# Patient Record
Sex: Female | Born: 1950
Health system: Southern US, Community
[De-identification: ages and names within clinical notes are randomized; demographics above are authoritative.]

## PROBLEM LIST (undated history)

## (undated) DIAGNOSIS — M1990 Unspecified osteoarthritis, unspecified site: Secondary | ICD-10-CM

## (undated) DIAGNOSIS — I10 Essential (primary) hypertension: Secondary | ICD-10-CM

---

## 2000-10-08 ENCOUNTER — Emergency Department (HOSPITAL_COMMUNITY): Admission: EM | Admit: 2000-10-08 | Discharge: 2000-10-08 | Payer: Self-pay | Admitting: *Deleted

## 2002-02-12 ENCOUNTER — Emergency Department (HOSPITAL_COMMUNITY): Admission: EM | Admit: 2002-02-12 | Discharge: 2002-02-12 | Payer: Self-pay

## 2002-02-26 ENCOUNTER — Other Ambulatory Visit: Admission: RE | Admit: 2002-02-26 | Discharge: 2002-02-26 | Payer: Self-pay | Admitting: Obstetrics and Gynecology

## 2002-04-09 ENCOUNTER — Other Ambulatory Visit: Admission: RE | Admit: 2002-04-09 | Discharge: 2002-04-09 | Payer: Self-pay | Admitting: *Deleted

## 2002-04-09 ENCOUNTER — Encounter: Admission: RE | Admit: 2002-04-09 | Discharge: 2002-04-09 | Payer: Self-pay | Admitting: *Deleted

## 2002-04-09 ENCOUNTER — Encounter (INDEPENDENT_AMBULATORY_CARE_PROVIDER_SITE_OTHER): Payer: Self-pay

## 2002-05-07 ENCOUNTER — Encounter: Admission: RE | Admit: 2002-05-07 | Discharge: 2002-05-07 | Payer: Self-pay | Admitting: Obstetrics and Gynecology

## 2002-05-20 ENCOUNTER — Ambulatory Visit (HOSPITAL_COMMUNITY): Admission: RE | Admit: 2002-05-20 | Discharge: 2002-05-20 | Payer: Self-pay | Admitting: Obstetrics and Gynecology

## 2002-05-20 ENCOUNTER — Encounter (INDEPENDENT_AMBULATORY_CARE_PROVIDER_SITE_OTHER): Payer: Self-pay

## 2002-06-04 ENCOUNTER — Encounter: Admission: RE | Admit: 2002-06-04 | Discharge: 2002-06-04 | Payer: Self-pay | Admitting: Obstetrics and Gynecology

## 2002-06-14 ENCOUNTER — Encounter: Payer: Self-pay | Admitting: Emergency Medicine

## 2002-06-14 ENCOUNTER — Inpatient Hospital Stay (HOSPITAL_COMMUNITY): Admission: EM | Admit: 2002-06-14 | Discharge: 2002-06-21 | Payer: Self-pay | Admitting: Emergency Medicine

## 2002-06-15 ENCOUNTER — Encounter: Payer: Self-pay | Admitting: General Surgery

## 2002-06-19 ENCOUNTER — Encounter: Payer: Self-pay | Admitting: Surgery

## 2002-07-23 ENCOUNTER — Encounter (INDEPENDENT_AMBULATORY_CARE_PROVIDER_SITE_OTHER): Payer: Self-pay | Admitting: Specialist

## 2002-07-23 ENCOUNTER — Ambulatory Visit (HOSPITAL_COMMUNITY): Admission: RE | Admit: 2002-07-23 | Discharge: 2002-07-23 | Payer: Self-pay | Admitting: Gastroenterology

## 2002-11-24 ENCOUNTER — Emergency Department (HOSPITAL_COMMUNITY): Admission: EM | Admit: 2002-11-24 | Discharge: 2002-11-24 | Payer: Self-pay | Admitting: Emergency Medicine

## 2002-12-17 ENCOUNTER — Encounter: Admission: RE | Admit: 2002-12-17 | Discharge: 2002-12-17 | Payer: Self-pay | Admitting: Obstetrics and Gynecology

## 2007-07-23 ENCOUNTER — Emergency Department (HOSPITAL_COMMUNITY): Admission: EM | Admit: 2007-07-23 | Discharge: 2007-07-23 | Payer: Self-pay | Admitting: Emergency Medicine

## 2007-11-15 ENCOUNTER — Ambulatory Visit: Payer: Self-pay | Admitting: Family Medicine

## 2007-11-19 ENCOUNTER — Ambulatory Visit: Payer: Self-pay | Admitting: *Deleted

## 2007-11-19 ENCOUNTER — Ambulatory Visit (HOSPITAL_COMMUNITY): Admission: RE | Admit: 2007-11-19 | Discharge: 2007-11-19 | Payer: Self-pay | Admitting: Family Medicine

## 2008-05-16 ENCOUNTER — Ambulatory Visit: Payer: Self-pay | Admitting: Family Medicine

## 2008-06-18 ENCOUNTER — Ambulatory Visit: Payer: Self-pay | Admitting: Family Medicine

## 2008-07-16 ENCOUNTER — Ambulatory Visit: Payer: Self-pay | Admitting: Internal Medicine

## 2008-07-21 ENCOUNTER — Ambulatory Visit (HOSPITAL_COMMUNITY): Admission: RE | Admit: 2008-07-21 | Discharge: 2008-07-21 | Payer: Self-pay | Admitting: Family Medicine

## 2008-08-18 ENCOUNTER — Encounter (INDEPENDENT_AMBULATORY_CARE_PROVIDER_SITE_OTHER): Payer: Self-pay | Admitting: Family Medicine

## 2008-08-18 ENCOUNTER — Ambulatory Visit: Payer: Self-pay | Admitting: Family Medicine

## 2008-08-26 ENCOUNTER — Ambulatory Visit (HOSPITAL_COMMUNITY): Admission: RE | Admit: 2008-08-26 | Discharge: 2008-08-26 | Payer: Self-pay | Admitting: Internal Medicine

## 2008-10-15 ENCOUNTER — Ambulatory Visit: Payer: Self-pay | Admitting: Internal Medicine

## 2009-01-17 ENCOUNTER — Emergency Department (HOSPITAL_COMMUNITY): Admission: EM | Admit: 2009-01-17 | Discharge: 2009-01-17 | Payer: Self-pay | Admitting: Emergency Medicine

## 2009-01-30 ENCOUNTER — Emergency Department (HOSPITAL_COMMUNITY): Admission: EM | Admit: 2009-01-30 | Discharge: 2009-01-30 | Payer: Self-pay | Admitting: Emergency Medicine

## 2009-02-10 ENCOUNTER — Ambulatory Visit: Payer: Self-pay | Admitting: Internal Medicine

## 2009-02-10 LAB — CONVERTED CEMR LAB
ALT: 14 units/L (ref 0–35)
Albumin: 4.7 g/dL (ref 3.5–5.2)
Alkaline Phosphatase: 100 units/L (ref 39–117)
BUN: 10 mg/dL (ref 6–23)
Chloride: 104 meq/L (ref 96–112)
Eosinophils Absolute: 0.1 10*3/uL (ref 0.0–0.7)
Glucose, Bld: 89 mg/dL (ref 70–99)
Hemoglobin: 13.1 g/dL (ref 12.0–15.0)
Lymphs Abs: 3.7 10*3/uL (ref 0.7–4.0)
Neutrophils Relative %: 41 % — ABNORMAL LOW (ref 43–77)
RBC: 4.29 M/uL (ref 3.87–5.11)
RDW: 14.2 % (ref 11.5–15.5)

## 2009-02-19 ENCOUNTER — Ambulatory Visit: Payer: Self-pay | Admitting: Internal Medicine

## 2009-03-04 ENCOUNTER — Ambulatory Visit: Payer: Self-pay | Admitting: Internal Medicine

## 2009-03-19 ENCOUNTER — Ambulatory Visit: Payer: Self-pay | Admitting: Internal Medicine

## 2009-08-06 ENCOUNTER — Emergency Department (HOSPITAL_COMMUNITY): Admission: EM | Admit: 2009-08-06 | Discharge: 2009-08-06 | Payer: Self-pay | Admitting: Emergency Medicine

## 2009-08-25 ENCOUNTER — Ambulatory Visit: Payer: Self-pay | Admitting: Family Medicine

## 2009-08-25 LAB — CONVERTED CEMR LAB
Basophils Absolute: 0 10*3/uL (ref 0.0–0.1)
CO2: 26 meq/L (ref 19–32)
Calcium: 10.5 mg/dL (ref 8.4–10.5)
Chloride: 105 meq/L (ref 96–112)
Creatinine, Ser: 0.8 mg/dL (ref 0.40–1.20)
Eosinophils Absolute: 0.1 10*3/uL (ref 0.0–0.7)
Glucose, Bld: 109 mg/dL — ABNORMAL HIGH (ref 70–99)
Hemoglobin: 12.4 g/dL (ref 12.0–15.0)
Lymphocytes Relative: 42 % (ref 12–46)
Lymphs Abs: 2.9 10*3/uL (ref 0.7–4.0)
MCHC: 32.3 g/dL (ref 30.0–36.0)
Monocytes Absolute: 0.4 10*3/uL (ref 0.1–1.0)
Neutro Abs: 3.4 10*3/uL (ref 1.7–7.7)
Neutrophils Relative %: 50 % (ref 43–77)
Platelets: 257 10*3/uL (ref 150–400)
RDW: 14.3 % (ref 11.5–15.5)
Sodium: 141 meq/L (ref 135–145)
WBC: 6.9 10*3/uL (ref 4.0–10.5)

## 2010-06-25 NOTE — Discharge Summary (Signed)
NAME:  Carol Merritt, Carol Merritt                       ACCOUNT NO.:  [id number]   MEDICAL RECORD NO.:  [id number]                   PATIENT TYPE:  INP   LOCATION:  5731                                 FACILITY:  [facility]   PHYSICIAN:  Velora Heckler, M.D.                DATE OF BIRTH:  22-Sep-1950   DATE OF ADMISSION:  06/13/2002  DATE OF DISCHARGE:  06/21/2002                                 DISCHARGE SUMMARY   REASON FOR ADMISSION:  Abdominal pain, intraperitoneal free air.   BRIEF HISTORY:  The patient is a 60 year old black female seen at the  request of Dr. Jennette Kettle in the emergency department at  [facility].  The patient presented to the emergency department with a several-day history  of epigastric abdominal pain.  The pain had become generalized over the 24  hours preceding admission.  She developed nausea and vomiting.  The patient  was noted to have an elevated white blood cell count.  CT scan of the  abdomen showed a small amount of free air in the upper abdomen, moderate  upper abdominal free fluid, but no evidence of contrast leak.  The patient  also was noted to have a thickened distal stomach and duodenum.  The  appendix and sigmoid colon appeared normal.  Physical examination was  consistent with a suspected perforated gastric ulcer or duodenal ulcer which  had subsequently sealed. The patient was admitted for management.   HOSPITAL COURSE:  The patient was admitted on the general surgical service.  She was started on intravenous antibiotics.  She was made NPO.  The patient  did well initially.  Fever resolved.  White count fell from 11.1 to 8.6.  Plain abdominal x-rays showed a tiny amount of free air under the right  hemidiaphragm.  The patient developed bowel sounds.  Nausea and vomiting  resolved.  Tenderness improved on physical exam.  Clear liquids were started  on the fourth hospital day.  The patient was continued on intravenous  Unasyn.  The patient was seen in  consultation by Petra Kuba, M.D., from  gastroenterology.  Her diet was advanced.  She was started on oral  medications.  White blood cell count remained normal.  The patient remained  afebrile.  She was prepared for discharge home on Jun 21, 2002.   DISCHARGE PLANNING:  The patient was discharged home Jun 21, 2002, in good  condition, tolerating a regular diet, and having normal bowel function.  The  patient will remain on proton pump inhibitors.  She will be seen back at my  office at [facility] in two weeks.  She will also be seen in  the office of Dr. Vida Rigger, who provided her with samples of Protonix at  the time of discharge.  Other discharge medications include Augmentin 875 mg  twice daily for one week, and Vicodin as needed for pain.   FINAL  DIAGNOSIS:  Perforated peptic ulcer.   CONDITION ON DISCHARGE:  Improved.                                               Velora Heckler, M.D.    TMG/MEDQ  D:  08/22/2002  T:  08/22/2002  Job:  [id number]

## 2010-06-25 NOTE — Op Note (Signed)
NAME:  Carol Merritt, Carol Merritt                       ACCOUNT NO.:  [id number]   MEDICAL RECORD NO.:  [id number]                   PATIENT TYPE:  AMB   LOCATION:  ENDO                                 FACILITY:  [facility]   PHYSICIAN:  Petra Kuba, M.D.                 DATE OF BIRTH:  [birth year]   DATE OF PROCEDURE:  07/23/2002  DATE OF DISCHARGE:                                 OPERATIVE REPORT   PROCEDURE:  Esophagogastroduodenoscopy with biopsy.   INDICATIONS:  Patient with a history of probable perforated ulcer.  Want to  document healing, rule out Helicobacter.  Consent was signed after risks,  benefits, methods, options were thoroughly discussed in the office and the  hospital on multiple occasions.   MEDICATIONS:  Demerol 80 mg and Versed 8 mg.   DESCRIPTION OF PROCEDURE:  The video endoscope was inserted by direct  vision.  The esophagus was normal. The scope was passed into the stomach  where some minimal gastritis was seen and advanced through a normal antrum,  normal pylorus into a normal duodenal bulb and around the C-loop to a normal  second portion of the duodenum.  The scope was then withdrawn back to the  bulb and a good look ruled out ulcers and malformations.  The scope  withdrawn back to the stomach retroflexed.  Angularis, cardia, fundus,  lesser and greater curve were all evaluated on retroflexion and straight  visualization without additional findings except for some minimal gastritis.  Two biopsies of the antrum to the proximal stomach were obtained to rule out  Helicobacter.  The scope was reinserted into the bulb and then around the  celiac just to double check which again did not reveal any ulcerations.  Air  was suctioned from the stomach.  The scope was slowly withdrawn and again a  good look at the esophagus was normal.  The scope was removed.  The patient  tolerated the procedure fair.  There was no obvious immediate complications.   ENDOSCOPIC DIAGNOSES:  1. Minimal gastritis, status post biopsy.  2. Otherwise normal esophagogastroduodenoscopy without signs of ulcers or     plaque.   PLAN:  Await biopsy and if biopsy reveals Helicobacter, would treat.  Otherwise no aspirin or nonsteroidals long term.  Be happy to see back  p.r.n.  Meanwhile, just ask her to finish her Protonix samples we have given  here and a prescription as well.                                               Petra Kuba, M.D.    MEM/MEDQ  D:  07/23/2002  T:  07/23/2002  Job:  [id number]   cc:   Velora Heckler, M.D.  [address]  [place]  [place]  [address]  Fax: [phone]

## 2010-06-25 NOTE — H&P (Signed)
NAME:  Carol Merritt, Carol Merritt                       ACCOUNT NO.:  [id number]   MEDICAL RECORD NO.:  [id number]                   PATIENT TYPE:  INP   LOCATION:  5731                                 FACILITY:  [facility]   PHYSICIAN:  Velora Heckler, M.D.                DATE OF BIRTH:  21-Apr-1950   DATE OF ADMISSION:  06/14/2002  DATE OF DISCHARGE:                                HISTORY & PHYSICAL   REASON FOR ADMISSION:  Abdominal pain, rule out perforated duodenal ulcer.   REFERRING PHYSICIAN:  Dr. Verlan Friends in the Emergency Department, [facility].   CHIEF COMPLAINT:  Abdominal pain.   HISTORY OF PRESENT ILLNESS:  The patient is a 60 year old black female who  presents to the emergency department with epigastric abdominal pain.  The  patient had noted epigastric abdominal pain for several days.  On 5/06, the  pain became generalized.  She developed nausea and vomiting.  She presented  to the emergency department for evaluation.  The patient was seen and  evaluated by the emergency room physician.  White blood cell count was  elevated at 11,000 with a left shift.  Plain abdominal x-ray showed a tiny  amount of free air in the right upper quadrant.  CT scan of the abdomen and  pelvis was obtained which showed a small amount of  free air around the  liver, free fluid around the liver and spleen, and thickening of the distal  stomach and duodenum.  General Surgery is consulted for evaluation and  management.   PAST MEDICAL HISTORY:  1. History of a heart murmur for which the patient does take antibiotic     prophylaxis with dental procedures.  2. Status post bilateral tubal ligation.  3. Status post benign breast biopsy x2.  4. Status post D&C in 05/2002, at [facility] by Dr. Gildardo Griffes for     dysfunctional bleeding.   MEDICATIONS:  Motrin p.r.n. for headaches.   ALLERGIES:  None known.   SOCIAL HISTORY:  The patient lives in [place].  She works at [employer] as a Conservation officer, nature.  She smokes less than a pack of cigarettes a day.  She  drinks alcohol in moderation.   FAMILY HISTORY:  Noncontributory.   REVIEW OF SYSTEMS:  15-system review without significant other positive.  The patient has had multiple small soft tissue masses excised from the right  back and foot.  The patient notes frequent headaches for which she takes  Motrin over the counter.  She has had no prior history of peptic ulcer  disease.   EXAM:  GENERAL:  A 60 year old thin black female on a stretcher in the  emergency department.  VITAL SIGNS:  Temp 97.7, pulse 70, respirations 22, blood pressure 130/75,  O2 saturation 99%.  HEENT:  Shows her to be normocephalic.  Sclerae are injected.  Conjunctivae  are injected.  Pupils are equal and  reactive.  Gaze is conjugate.  Dentition  is good.  Voice quality is normal.  NECK:  Anterior examination of the neck shows it to be symmetric.  There are  no palpable masses.  Thyroid is normal without nodularity.  There is no  anterior or posterior cervical adenopathy.  LUNGS:  Clear to auscultation bilaterally.  CARDIAC:  Exam shows a regular rate and rhythm.  Peripheral pulses are full.  BREASTS: Left breast shows well-healed surgical wounds consistent with  previous biopsy.  ABDOMEN:  Mildly to moderately distended.  There are bowel sounds present.  There is a well-healed surgical wound in the midline below the umbilicus.  There is diffuse tenderness to palpation and percussion maximal in the  epigastrium.  There is voluntary guarding.  There are no palpable masses.  There is no sign of hernia.  EXTREMITIES:  Nontender, without edema.  NEUROLOGIC:  The patient is alert and oriented, without focal deficit.   DATA REVIEWED:  Laboratory studies include a white blood count 11.1,  hemoglobin 14.9, hematocrit 43.3%, platelet count 211,000.  Differential  shows 91% neutrophils, 7% lymphocytes.  Urinalysis shows small leukocyte  esterase  and nitrite negative with 0 to 3 white cells per high-powered field  on microscopic exam.  Chemistry profile shows normal electrolytes.  Lipase  is normal at 34.   CT scan abdomen and pelvis reviewed with Dr. Laveda Abbe from Radiology.  Again,  this shows small amount of  free air around the liver.  There is a small  amount of fluid around the liver and the spleen.  There is some fluid in the  lesser sac.  There is no extravasation of oral contrast.   DIAGNOSES:  1. Probable perforated peptic ulcer, which has now sealed.  2. History of nonsteroidal anti-inflammatory use.  3. Recent history of dilation and curettage.   PLAN:  1. Admission to [facility].  2. Initiation of intravenous antibiotics.  3. NPO with intravenous hydration.  4. Repeat CT scan 24 to 48 hours.  5. Operative intervention if clinical course deteriorates.                                               Velora Heckler, M.D.    TMG/MEDQ  D:  06/14/2002  T:  06/15/2002  Job:  [id number]

## 2010-06-25 NOTE — Op Note (Signed)
   NAME:  Carol Merritt, Carol Merritt                       ACCOUNT NO.:  [id number]   MEDICAL RECORD NO.:  [id number]                   PATIENT TYPE:  AMB   LOCATION:  SDC                                  FACILITY:  [facility]   PHYSICIAN:  Phil D. Okey Dupre, M.D.                  DATE OF BIRTH:  April 06, 1950   DATE OF PROCEDURE:  05/20/2002  DATE OF DISCHARGE:                                 OPERATIVE REPORT   PROCEDURE:  D&C hysteroscopy and uterine polypectomy.   PREOPERATIVE DIAGNOSIS:  Menometorrhagia.   POSTOPERATIVE DIAGNOSES:  1. Menometorrhagia.  2. Uterine polyp, pending pathology report.   SURGEON:  Javier Glazier. Okey Dupre, M.D.   FINDINGS:  A small polyp in the right side of the uterus just below the  fallopian tube ostium. Other than that, the uterine cavity seemed completely  normal.   DESCRIPTION OF PROCEDURE:  Under satisfactory general anesthesia, the  patient in the dorsal  lithotomy position, the peritoneum and vagina were  prepped and draped in the usual sterile manner. Bimanual pelvic examination  under anesthesia revealed the uterus to be normal size, shape and  consistency. Anterior reflexion revealed normal free adnexa. A weighted  speculum was placed in the posterior fourchette of the vagina and showed  marital introitus. The BUS was within normal limits. The vagina was clean  and well rugated with a first degree cystorectocele. The anterior lip of the  cervix was grasped with a single tooth tenaculum and the uterine cavity  sounded to a depth of 8 cm. The cervical os dilated to a #6 Hagar dilator.  The uterine cavity explored with a hysteroscope using normal saline as a  dilating medium and the findings were as aforementioned. A polyp forceps was  then used to remove the polyp that was seen just below the ostium on the  right side. The uterine cavity was vigorously curetted with a small serrated  curette. Towards the right side in the area where the polyp was seemed to be  some  calcified material that was obtained. The tissue was all sent for  pathological diagnosis. There was minimal bleeding during the procedure,  less than 10 mL blood loss. The patient tolerated the procedure well and was  transferred to the recovery room in satisfactory condition. Tissue sent for  pathological diagnosis was a uterine polyp and uterine curettings.                                               Phil D. Okey Dupre, M.D.    PDR/MEDQ  D:  05/20/2002  T:  05/20/2002  Job:  [id number]

## 2010-06-25 NOTE — Consult Note (Signed)
NAME:  Carol Merritt, Carol Merritt                       ACCOUNT NO.:  [id number]   MEDICAL RECORD NO.:  [id number]                   PATIENT TYPE:  INP   LOCATION:  5731                                 FACILITY:  [facility]   PHYSICIAN:  Petra Kuba, M.D.                 DATE OF BIRTH:  1950/04/09   DATE OF CONSULTATION:  06/19/2002  DATE OF DISCHARGE:                                   CONSULTATION   REFERRING PHYSICIAN:  Velora Heckler, M.D.   HISTORY OF PRESENT ILLNESS:  The patient was admitted on May 6, for probable  perforated ulcer.  Dr. Gerrit Friends elected to treat her medically and she did  slowly improve with IV antibiotics and NPO.  She was started on clear  liquids and was able to do well.  Although repeat CT is pending at the time  of dictation, her diet was advanced.  She had some periodic stomach pains  with some gynecologic issues and was prescribed Motrin for long-standing.  She has never had any GI tests before and never told that she had ulcers.   PAST MEDICAL HISTORY:  1. Heart murmur.  2. Tubal ligation.  3. Benign breast biopsies.  4. D&C in April 2004, by Dr. Carey Bullocks, for dysfunctional bleeding.   CURRENT MEDICATIONS:  1. Morphine.  2. Unasyn.  3. Protonix.  4. Phenergan.  5. Compazine.  6. Reglan.  7. Zofran.  8. Benadryl.   FAMILY HISTORY:  Pertinent for two sisters with pancreatitis who do drink.  No ulcers run in the family.   ALLERGIES:  No known drug allergies.   REVIEW OF SYMPTOMS:  Pertinent for her feeling better and confirmed that she  had been on a lot of Motrin for her gynecologic problems.   PHYSICAL EXAMINATION:  GENERAL:  In no acute distress.  VITAL SIGNS:  Stable.  Afebrile.  ABDOMEN:  Sore without any guarding or rebound in her upper abdomen.   LABORATORY DATA AND X-RAY FINDINGS:  Pertinent for hemoglobin of 10.8 with  an MCV of 97, white count 3.6, platelet count 188.  On admission, her white  count was 11.1 and her hemoglobin was normal.   BUN was 14 on admission.  Liver tests normal on admission as was her lipase.   Initial CT scan pertinent for a few bubbles around the liver as well as KUB  showing some mild pneumoperitoneum as well as a little bit of fluid, but no  other obvious abnormality.   ASSESSMENT:  Probable perforated ulcer treated medically, probably due to  her Motrin.   RECOMMENDATIONS:  1. Continue antibiotics and advancing diet per Dr. Gerrit Friends.  2. Continue pump inhibitors b.i.d. in the hospital and one today as an     outpatient.  3. No aspirin or nonsteroidals.  4. Tylenol only as an outpatient.  5. Risks, benefits and methods of endoscopy were discussed and will proceed  as an outpatient in about two weeks.  We discussed why we did not want to     do it right away to allow the ulcer to heal and decrease risk of     perforation.  Will check her for Helicobacter at that point.  6. Will follow with you and check on CT scan.                                                 Petra Kuba, M.D.    MEM/MEDQ  D:  06/19/2002  T:  06/20/2002  Job:  [id number]

## 2012-09-26 ENCOUNTER — Encounter (HOSPITAL_COMMUNITY): Payer: Self-pay | Admitting: Emergency Medicine

## 2012-09-26 ENCOUNTER — Emergency Department (HOSPITAL_COMMUNITY)
Admission: EM | Admit: 2012-09-26 | Discharge: 2012-09-26 | Disposition: A | Discharge status: Home / Self Care | Payer: BC Managed Care – PPO | Attending: Emergency Medicine | Admitting: Emergency Medicine

## 2012-09-26 DIAGNOSIS — Z8739 Personal history of other diseases of the musculoskeletal system and connective tissue: Secondary | ICD-10-CM | POA: Insufficient documentation

## 2012-09-26 DIAGNOSIS — F17200 Nicotine dependence, unspecified, uncomplicated: Secondary | ICD-10-CM | POA: Insufficient documentation

## 2012-09-26 DIAGNOSIS — I83893 Varicose veins of bilateral lower extremities with other complications: Secondary | ICD-10-CM | POA: Insufficient documentation

## 2012-09-26 DIAGNOSIS — I83811 Varicose veins of right lower extremities with pain: Secondary | ICD-10-CM

## 2012-09-26 DIAGNOSIS — I10 Essential (primary) hypertension: Secondary | ICD-10-CM | POA: Insufficient documentation

## 2012-09-26 HISTORY — DX: Essential (primary) hypertension: I10

## 2012-09-26 HISTORY — DX: Unspecified osteoarthritis, unspecified site: M19.90

## 2012-09-26 MED ORDER — CELECOXIB 100 MG PO CAPS: 100.0000 mg | ORAL_CAPSULE | Freq: Two times a day (BID) | ORAL | Status: DC

## 2012-09-26 NOTE — ED Provider Notes (Signed)
Medical screening examination/treatment/procedure(s) were performed by non-physician practitioner and as supervising physician I was immediately available for consultation/collaboration.   Jarmarcus Wambold, MD 09/26/12 1459 

## 2012-09-26 NOTE — ED Notes (Signed)
Leg pain x several months used to be on celebrex but she does not have any more because [facility] closed is on her feet a lot at work at [employer]

## 2012-09-26 NOTE — ED Notes (Signed)
DEPARTURE CONDITION AND CARE HANDOFF DOCUMENTED ON WRONG CHART.

## 2012-09-26 NOTE — ED Provider Notes (Signed)
  CSN: [id number]     Arrival date & time 09/26/12  1319 History     First MD Initiated Contact with Patient 09/26/12 1331     Chief Complaint  Patient presents with  . Leg Pain   (Consider location/radiation/quality/duration/timing/severity/associated sxs/prior Treatment) Patient is a 62 y.o. female presenting with leg pain. The history is provided by the patient and medical records.  Leg Pain  Patient presents to the ED for bilateral leg pain, R > L x several weeks.  Pt states she works at [employer] and has to stand for an entire 8hr shift.  Pain always worse at the end of the day after her shift, stating they feel very "heavy".  Denies any recent injury, trauma, or falls.  Pt states she was a pt of [facility], but she has not seen a PCP since they closed.  Used to take celebrex for arthritis which controlled her sx very well.  Pt hypertensive-- states she used to take BP meds but unsure what they were but she has papers at home with them listed.  Past Medical History  Diagnosis Date  . Arthritis   . Hypertension    No past surgical history on file. No family history on file. History  Substance Use Topics  . Smoking status: Current Every Day Smoker  . Smokeless tobacco: Not on file  . Alcohol Use: Yes   OB History   Grav Para Term Preterm Abortions TAB SAB Ect Mult Living                 Review of Systems  Musculoskeletal: Positive for arthralgias.  All other systems reviewed and are negative.    Allergies  Review of patient's allergies indicates no known allergies.  Home Medications  No current outpatient prescriptions on file. BP 190/74  Temp(Src) 98.3 F (36.8 C) (Oral)  Resp 18  SpO2 100%  Physical Exam  Nursing note and vitals reviewed. Constitutional: She is oriented to person, place, and time. She appears well-developed and well-nourished. No distress.  HENT:  Head: Normocephalic and atraumatic.  Eyes: Conjunctivae and EOM are normal. Pupils are equal, round, and  reactive to light.  Neck: Normal range of motion. Neck supple.  Cardiovascular: Normal rate, regular rhythm and normal heart sounds.   Pulmonary/Chest: Effort normal and breath sounds normal. No respiratory distress. She has no wheezes.  Musculoskeletal: Normal range of motion. She exhibits no edema.  2 small varicose veins of left lateral calf, none on left; no calf pain, asymmetry, or palpable cord; negative Homan's sign; strong distal pulses bilaterally, sensation intact; normal gait unassisted  Neurological: She is alert and oriented to person, place, and time.  Skin: Skin is warm and dry. She is not diaphoretic.  Psychiatric: She has a normal mood and affect.    ED Course   Procedures (including critical care time)  Labs Reviewed - No data to display No results found.  1. Varicose veins with pain, right     MDM   Aches likely due to arthritis, heavy sensation likely due to varicose veins. Doubt DVT.  Will start back on celebrex. Advised to elevate legs while at home, esp after working.   Pt hypertensive 190/74 with a known hx of HTN and not currently on meds.  Instructed to FU with [facility] and take old medical records so they can re-start her maintenance meds.  Discussed plan with pt, she agreed.  Return precautions advised.  Garlon Hatchet, PA-C 09/26/12 1456

## 2012-09-26 NOTE — ED Notes (Signed)
Pt c/o right knee and lower leg pain. Has had for several years. Former pt of [facility] but no longer has PCP. Also out of BP meds.

## 2012-10-09 ENCOUNTER — Encounter: Payer: Self-pay | Admitting: Internal Medicine

## 2012-10-09 ENCOUNTER — Ambulatory Visit: Payer: BC Managed Care – PPO | Attending: Internal Medicine | Admitting: Internal Medicine

## 2012-10-09 VITALS — BP 227/84 | HR 87 | Temp 98.6°F | Resp 16 | Ht 66.0 in | Wt 149.0 lb

## 2012-10-09 DIAGNOSIS — I10 Essential (primary) hypertension: Secondary | ICD-10-CM | POA: Insufficient documentation

## 2012-10-09 DIAGNOSIS — M1711 Unilateral primary osteoarthritis, right knee: Secondary | ICD-10-CM

## 2012-10-09 DIAGNOSIS — F17200 Nicotine dependence, unspecified, uncomplicated: Secondary | ICD-10-CM | POA: Insufficient documentation

## 2012-10-09 DIAGNOSIS — M1710 Unilateral primary osteoarthritis, unspecified knee: Secondary | ICD-10-CM | POA: Insufficient documentation

## 2012-10-09 LAB — CBC WITH DIFFERENTIAL/PLATELET
Eosinophils Relative: 3 % (ref 0–5)
Lymphocytes Relative: 44 % (ref 12–46)
MCH: 30.9 pg (ref 26.0–34.0)
MCV: 93.1 fL (ref 78.0–100.0)
Monocytes Relative: 8 % (ref 3–12)
Neutro Abs: 3.8 10*3/uL (ref 1.7–7.7)
RBC: 4.04 MIL/uL (ref 3.87–5.11)
WBC: 8.5 10*3/uL (ref 4.0–10.5)

## 2012-10-09 LAB — CMP AND LIVER
BUN: 13 mg/dL (ref 6–23)
CO2: 27 mEq/L (ref 19–32)
Calcium: 10 mg/dL (ref 8.4–10.5)
Glucose, Bld: 101 mg/dL — ABNORMAL HIGH (ref 70–99)
Potassium: 3.7 mEq/L (ref 3.5–5.3)
Sodium: 143 mEq/L (ref 135–145)

## 2012-10-09 LAB — LIPID PANEL
HDL: 83 mg/dL (ref >39)
LDL Cholesterol: 108 mg/dL — ABNORMAL HIGH (ref 0–99)
Total CHOL/HDL Ratio: 2.5 Ratio
Triglycerides: 88 mg/dL (ref <150)

## 2012-10-09 LAB — TSH: TSH: 1.327 u[IU]/mL (ref 0.350–4.500)

## 2012-10-09 MED ORDER — LISINOPRIL-HYDROCHLOROTHIAZIDE 10-12.5 MG PO TABS: 1.0000 | ORAL_TABLET | Freq: Every day | ORAL | Status: DC

## 2012-10-09 MED ORDER — CLONIDINE HCL 0.1 MG PO TABS
0.1000 mg | ORAL_TABLET | Freq: Once | ORAL | Status: AC
  Administered 2012-10-09: 0.1 mg via ORAL

## 2012-10-09 MED ORDER — TRAMADOL HCL 50 MG PO TABS: 50.0000 mg | ORAL_TABLET | Freq: Four times a day (QID) | ORAL | Status: DC | PRN

## 2012-10-09 NOTE — Progress Notes (Signed)
Patient ID: Carol Merritt, female   DOB: 02/27/1950, 62 y.o.   MRN: [id number]  CC: To establish care  HPI: Carol Merritt is a 62 years old pleasant woman who came to the clinic today to establish medical care. She had been in the ER recently for bilateral leg pains which she claims to be worse as the day progresses, she works standing all day as a Metallurgist at [employer], in ED tests were done and bilateral DVT was ruled out. She has no specific complaints today except for the bilateral neck pain, more of claudication. She has osteoarthritis of the right knee and has been on Celebrex in the past. Today blood pressure is very high 227/84 mmHg. she denies chest pain, no headache, no shortness of breath. Family history is positive for hypertension and diabetes, but she's never diagnosed to have diabetes. No leg swelling.  No Known Allergies Past Medical History  Diagnosis Date  . Arthritis   . Hypertension    Current Outpatient Prescriptions on File Prior to Visit  Medication Sig Dispense Refill  . ibuprofen (ADVIL,MOTRIN) 200 MG tablet Take 400 mg by mouth every 6 (six) hours as needed for pain.      . celecoxib (CELEBREX) 100 MG capsule Take 1 capsule (100 mg total) by mouth 2 (two) times daily.  30 capsule  0   No current facility-administered medications on file prior to visit.   History reviewed. No pertinent family history. History   Social History  . Marital Status: Divorced    Spouse Name: N/A    Number of Children: N/A  . Years of Education: N/A   Occupational History  . Not on file.   Social History Main Topics  . Smoking status: Current Every Day Smoker  . Smokeless tobacco: Not on file  . Alcohol Use: Yes  . Drug Use: Not on file  . Sexual Activity: Not on file   Other Topics Concern  . Not on file   Social History Narrative  . No narrative on file    Review of Systems: Constitutional: Negative for fever, chills, diaphoresis, activity change, appetite change and  fatigue. HENT: Negative for ear pain, nosebleeds, congestion, facial swelling, rhinorrhea, neck pain, neck stiffness and ear discharge.  Eyes: Negative for pain, discharge, redness, itching and visual disturbance. Respiratory: Negative for cough, choking, chest tightness, shortness of breath, wheezing and stridor.  Cardiovascular: Negative for chest pain, palpitations and leg swelling. Gastrointestinal: Negative for abdominal distention. Genitourinary: Negative for dysuria, urgency, frequency, hematuria, flank pain, decreased urine volume, difficulty urinating and dyspareunia.  Musculoskeletal: Negative for back pain, joint swelling, arthralgias and gait problem. Neurological: Negative for dizziness, tremors, seizures, syncope, facial asymmetry, speech difficulty, weakness, light-headedness, numbness and headaches.  Hematological: Negative for adenopathy. Does not bruise/bleed easily. Psychiatric/Behavioral: Negative for hallucinations, behavioral problems, confusion, dysphoric mood, decreased concentration and agitation.    Objective:   Filed Vitals:   10/09/12 0910  BP: 227/84  Pulse: 87  Temp: 98.6 F (37 C)  Resp: 16    Physical Exam: Constitutional: Patient appears well-developed and well-nourished. No distress. HENT: Normocephalic, atraumatic, External right and left ear normal. Oropharynx is clear and moist.  Eyes: Conjunctivae and EOM are normal. PERRLA, no scleral icterus. Neck: Normal ROM. Neck supple. No JVD. No tracheal deviation. No thyromegaly. CVS: RRR, S1/S2 +, no murmurs, no gallops, no carotid bruit.  Pulmonary: Effort and breath sounds normal, no stridor, rhonchi, wheezes, rales.  Abdominal: Soft. BS +,  no distension, tenderness,  rebound or guarding.  Musculoskeletal: Normal range of motion. No edema and mild tenderness on right leg, very mild varicose vein. Lymphadenopathy: No lymphadenopathy noted, cervical, inguinal or axillary Neuro: Alert. Normal reflexes,  muscle tone coordination. No cranial nerve deficit. Skin: Skin is warm and dry. No rash noted. Not diaphoretic. No erythema. No pallor. Psychiatric: Normal mood and affect. Behavior, judgment, thought content normal.  Lab Results  Component Value Date   WBC 6.9 08/25/2009   HGB 12.4 08/25/2009   HCT 38.4 08/25/2009   MCV 97.0 08/25/2009   PLT 257 08/25/2009   Lab Results  Component Value Date   CREATININE 0.80 08/25/2009   BUN 23 08/25/2009   NA 141 08/25/2009   K 4.7 08/25/2009   CL 105 08/25/2009   CO2 26 08/25/2009    No results found for this basename: HGBA1C   Lipid Panel  No results found for this basename: chol, trig, hdl, cholhdl, vldl, ldlcalc       Assessment and plan:   Patient Active Problem List   Diagnosis Date Noted  . Accelerated hypertension 10/09/2012  . Osteoarthritis of right knee 10/09/2012  . Nicotine dependence 10/09/2012   Give clonidine 0.1 mg tablet now for accelerated hypertension, repeat blood pressure in half hour Lisinopril-hydrochlorothiazide 10-12.5 mg tablet by mouth daily Tramadol 50 mg tablet by mouth each bedtime when necessary pain  Labs: CBC differential TSH CMP Lipid panel Hemoglobin A1c Urinalysis  Patient extensively counseled about smoking cessation Patient educated about hypertension and its complication She was encouraged to be compliant with medications Return in one week for followup blood pressure and lab results  Carol Merritt was given clear instructions to go to ER or return to the clinic if symptoms don't improve, worsen or new problems develop.  Carol Merritt verbalized understanding.  Carol Merritt was told to call to get lab results if hasn't heard anything in the next week.        Carol Lewandowsky, MD [facility] [place], [place] [phone]   10/09/2012, 9:45 AM

## 2012-10-09 NOTE — Progress Notes (Signed)
HFU VARICOSE VEIN R LEG WITH PAIN NEEDS BP RESTARTED. PT STOPPED TAKING X 1 YR AGO  BP 227/84 C/O H/A AND BLURRY VISION

## 2012-10-09 NOTE — Patient Instructions (Signed)

## 2012-10-10 LAB — URINALYSIS, COMPLETE
Bacteria, UA: NONE SEEN
Bilirubin Urine: NEGATIVE
Casts: NONE SEEN
Glucose, UA: NEGATIVE mg/dL
Hgb urine dipstick: NEGATIVE
Nitrite: NEGATIVE
pH: 6 (ref 5.0–8.0)

## 2012-10-22 ENCOUNTER — Ambulatory Visit: Payer: BC Managed Care – PPO

## 2012-10-24 ENCOUNTER — Ambulatory Visit: Payer: BC Managed Care – PPO | Attending: Internal Medicine

## 2012-10-24 NOTE — Progress Notes (Unsigned)
PT HERE FOR REPEAT BP. C/O SLIGHT FRONTAL H/A.TAKING PRESCRIBED MEDS DAILY Subjective:    Patient ID: Carol Merritt, female    DOB: March 16, 1950, 63 y.o.   MRN: [id number]  HPI    Review of Systems     Objective:   Physical Exam        Assessment & Plan:

## 2012-12-27 ENCOUNTER — Encounter (HOSPITAL_COMMUNITY): Payer: Self-pay | Admitting: Emergency Medicine

## 2012-12-27 ENCOUNTER — Emergency Department (INDEPENDENT_AMBULATORY_CARE_PROVIDER_SITE_OTHER)
Admission: EM | Admit: 2012-12-27 | Discharge: 2012-12-27 | Disposition: A | Discharge status: Home / Self Care | Payer: BC Managed Care – PPO | Source: Home / Self Care

## 2012-12-27 DIAGNOSIS — K047 Periapical abscess without sinus: Secondary | ICD-10-CM

## 2012-12-27 DIAGNOSIS — K089 Disorder of teeth and supporting structures, unspecified: Secondary | ICD-10-CM

## 2012-12-27 DIAGNOSIS — K0889 Other specified disorders of teeth and supporting structures: Secondary | ICD-10-CM

## 2012-12-27 DIAGNOSIS — K029 Dental caries, unspecified: Secondary | ICD-10-CM

## 2012-12-27 MED ORDER — AMOXICILLIN 500 MG PO CAPS: 1000.0000 mg | ORAL_CAPSULE | Freq: Two times a day (BID) | ORAL | Status: DC

## 2012-12-27 MED ORDER — HYDROCODONE-ACETAMINOPHEN 5-325 MG PO TABS: 1.0000 | ORAL_TABLET | Freq: Four times a day (QID) | ORAL | Status: DC | PRN

## 2012-12-27 MED ORDER — NITROGLYCERIN 0.4 MG SL SUBL
SUBLINGUAL_TABLET | SUBLINGUAL | Status: AC
  Filled 2012-12-27: qty 25

## 2012-12-27 MED ORDER — ONDANSETRON 4 MG PO TBDP
ORAL_TABLET | ORAL | Status: AC
  Filled 2012-12-27: qty 2

## 2012-12-27 NOTE — ED Provider Notes (Signed)
CSN: [id number]     Arrival date & time 12/27/12  1116 History   None    No chief complaint on file.  (Consider location/radiation/quality/duration/timing/severity/associated sxs/prior Treatment) HPI Comments: 62 year old female presents with pain in the gums, face and teeth. She points to the right face where there is mild swelling in the upper right teeth as the source of pain. She states that she recently saw a dentist for these complaints and they only spoke about money. She states that he did not offer a solution for problem or if her any medication. She does not want to go back to see him.   Past Medical History  Diagnosis Date  . Arthritis   . Hypertension    No past surgical history on file. No family history on file. History  Substance Use Topics  . Smoking status: Current Every Day Smoker  . Smokeless tobacco: Not on file  . Alcohol Use: Yes   OB History   Grav Para Term Preterm Abortions TAB SAB Ect Mult Living                 Review of Systems  HENT:       Mouth right facial swelling opposite to the right upper teeth. Oral exam reveals poor dental repair, multiple teeth with enamel abrasions and caries. The upper right teeth have obvious caries with surrounding swelling and redness. Several teeth have dental tenderness. No swelling of the tongue or buccal mucosa or oropharynx.  Respiratory: Negative.   Neurological: Negative.   All other systems reviewed and are negative.    Allergies  Review of patient's allergies indicates no known allergies.  Home Medications   Current Outpatient Rx  Name  Route  Sig  Dispense  Refill  . amoxicillin (AMOXIL) 500 MG capsule   Oral   Take 2 capsules (1,000 mg total) by mouth 2 (two) times daily.   28 capsule   0   . calcium-vitamin D (OSCAL WITH D) 500-200 MG-UNIT per tablet   Oral   Take 1 tablet by mouth.         . celecoxib (CELEBREX) 100 MG capsule   Oral   Take 1 capsule (100 mg total) by mouth 2 (two) times  daily.   30 capsule   0   . cyclobenzaprine (FLEXERIL) 10 MG tablet   Oral   Take 10 mg by mouth 3 (three) times daily as needed for muscle spasms.         Marland Kitchen HYDROcodone-acetaminophen (NORCO) 5-325 MG per tablet   Oral   Take 1 tablet by mouth every 6 (six) hours as needed for moderate pain.   20 tablet   0   . ibuprofen (ADVIL,MOTRIN) 200 MG tablet   Oral   Take 400 mg by mouth every 6 (six) hours as needed for pain.         Marland Kitchen lisinopril-hydrochlorothiazide (PRINZIDE,ZESTORETIC) 10-12.5 MG per tablet   Oral   Take 1 tablet by mouth daily.   90 tablet   3   . traMADol (ULTRAM) 50 MG tablet   Oral   Take 1 tablet (50 mg total) by mouth every 6 (six) hours as needed for pain.   30 tablet   1    BP 176/82  Pulse 64  Temp(Src) 99 F (37.2 C) (Oral)  Resp 16  SpO2 99% Physical Exam  Constitutional: She is oriented to person, place, and time. She appears well-developed and well-nourished. No distress.  HENT:  Mouth/Throat:  No oropharyngeal exudate.  Mouth right facial swelling opposite to the right upper teeth. Oral exam reveals poor dental repair, multiple teeth with enamel abrasions and caries. The upper right teeth have obvious caries with surrounding swelling and redness. Several teeth have dental tenderness. No swelling of the tongue or buccal mucosa or oropharynx.  Neck: Normal range of motion. Neck supple.  Cardiovascular: Normal rate.   Pulmonary/Chest: No respiratory distress.  Lymphadenopathy:    She has no cervical adenopathy.  Neurological: She is alert and oriented to person, place, and time.  Skin: Skin is warm and dry.    ED Course  Procedures (including critical care time) Labs Review Labs Reviewed - No data to display Imaging Review No results found.    MDM   1. Toothache   2. Dental abscess   3. Dental caries extending into pulp      Amoxil as directed norco 5 mg #20 Must see dentist ASAP    Hayden Rasmussen, NP 12/27/12 1236

## 2012-12-27 NOTE — ED Notes (Signed)
Mouth pain and swelling, onset today

## 2012-12-29 NOTE — ED Provider Notes (Signed)
Medical screening examination/treatment/procedure(s) were performed by a resident physician or non-physician practitioner and as the supervising physician I was immediately available for consultation/collaboration.  Jhamal Plucinski, MD    Geanine Vandekamp S Eleftheria Taborn, MD 12/29/12 0837 

## 2013-09-09 ENCOUNTER — Encounter: Payer: Self-pay | Admitting: Internal Medicine

## 2013-09-09 ENCOUNTER — Ambulatory Visit: Payer: BC Managed Care – PPO | Attending: Internal Medicine | Admitting: Internal Medicine

## 2013-09-09 VITALS — BP 166/77 | HR 60 | Temp 99.1°F | Resp 16 | Ht 66.0 in | Wt 148.0 lb

## 2013-09-09 DIAGNOSIS — M1711 Unilateral primary osteoarthritis, right knee: Secondary | ICD-10-CM | POA: Insufficient documentation

## 2013-09-09 DIAGNOSIS — I10 Essential (primary) hypertension: Secondary | ICD-10-CM

## 2013-09-09 DIAGNOSIS — M545 Low back pain, unspecified: Secondary | ICD-10-CM

## 2013-09-09 DIAGNOSIS — F17200 Nicotine dependence, unspecified, uncomplicated: Secondary | ICD-10-CM | POA: Insufficient documentation

## 2013-09-09 DIAGNOSIS — M1710 Unilateral primary osteoarthritis, unspecified knee: Secondary | ICD-10-CM

## 2013-09-09 LAB — LIPID PANEL
CHOLESTEROL: 213 mg/dL — AB (ref 0–200)
HDL: 67 mg/dL (ref >39)
LDL Cholesterol: 111 mg/dL — ABNORMAL HIGH (ref 0–99)
TRIGLYCERIDES: 174 mg/dL — AB (ref <150)
Total CHOL/HDL Ratio: 3.2 Ratio
VLDL: 35 mg/dL (ref 0–40)

## 2013-09-09 LAB — CBC WITH DIFFERENTIAL/PLATELET
BASOS ABS: 0 10*3/uL (ref 0.0–0.1)
Basophils Relative: 0 % (ref 0–1)
EOS ABS: 0.2 10*3/uL (ref 0.0–0.7)
EOS PCT: 2 % (ref 0–5)
HEMATOCRIT: 36 % (ref 36.0–46.0)
HEMOGLOBIN: 12.2 g/dL (ref 12.0–15.0)
LYMPHS PCT: 41 % (ref 12–46)
Lymphs Abs: 3.7 10*3/uL (ref 0.7–4.0)
MCH: 31.2 pg (ref 26.0–34.0)
MCHC: 33.9 g/dL (ref 30.0–36.0)
MCV: 92.1 fL (ref 78.0–100.0)
MONOS PCT: 6 % (ref 3–12)
Monocytes Absolute: 0.5 10*3/uL (ref 0.1–1.0)
Neutro Abs: 4.6 10*3/uL (ref 1.7–7.7)
Neutrophils Relative %: 51 % (ref 43–77)
PLATELETS: 303 10*3/uL (ref 150–400)
RBC: 3.91 MIL/uL (ref 3.87–5.11)
RDW: 14.5 % (ref 11.5–15.5)
WBC: 9.1 10*3/uL (ref 4.0–10.5)

## 2013-09-09 LAB — COMPLETE METABOLIC PANEL WITH GFR
ALBUMIN: 4.4 g/dL (ref 3.5–5.2)
ALK PHOS: 96 U/L (ref 39–117)
ALT: 15 U/L (ref 0–35)
AST: 20 U/L (ref 0–37)
BILIRUBIN TOTAL: 0.3 mg/dL (ref 0.2–1.2)
BUN: 13 mg/dL (ref 6–23)
CHLORIDE: 102 meq/L (ref 96–112)
CO2: 28 meq/L (ref 19–32)
Calcium: 10.1 mg/dL (ref 8.4–10.5)
Creat: 0.74 mg/dL (ref 0.50–1.10)
GFR, Est Non African American: 86 mL/min
Glucose, Bld: 107 mg/dL — ABNORMAL HIGH (ref 70–99)
POTASSIUM: 3.9 meq/L (ref 3.5–5.3)
SODIUM: 136 meq/L (ref 135–145)
TOTAL PROTEIN: 6.7 g/dL (ref 6.0–8.3)

## 2013-09-09 MED ORDER — LISINOPRIL-HYDROCHLOROTHIAZIDE 20-12.5 MG PO TABS: 1.0000 | ORAL_TABLET | Freq: Every day | ORAL | Status: DC

## 2013-09-09 MED ORDER — TRAMADOL HCL 50 MG PO TABS: 50.0000 mg | ORAL_TABLET | Freq: Four times a day (QID) | ORAL | Status: DC | PRN

## 2013-09-09 MED ORDER — CYCLOBENZAPRINE HCL 10 MG PO TABS: 10.0000 mg | ORAL_TABLET | Freq: Three times a day (TID) | ORAL | Status: DC | PRN

## 2013-09-09 NOTE — Progress Notes (Signed)
Pt is here following up on her HTN. Pt states that she is having acute lower back pain.

## 2013-09-09 NOTE — Patient Instructions (Signed)
DASH Eating Plan DASH stands for "Dietary Approaches to Stop Hypertension." The DASH eating plan is a healthy eating plan that has been shown to reduce high blood pressure (hypertension). Additional health benefits may include reducing the risk of type 2 diabetes mellitus, heart disease, and stroke. The DASH eating plan may also help with weight loss. WHAT DO I NEED TO KNOW ABOUT THE DASH EATING PLAN? For the DASH eating plan, you will follow these general guidelines:  Choose foods with a percent daily value for sodium of less than 5% (as listed on the food label).  Use salt-free seasonings or herbs instead of table salt or sea salt.  Check with your health care provider or pharmacist before using salt substitutes.  Eat lower-sodium products, often labeled as "lower sodium" or "no salt added."  Eat fresh foods.  Eat more vegetables, fruits, and low-fat dairy products.  Choose whole grains. Look for the word "whole" as the first word in the ingredient list.  Choose fish and skinless chicken or turkey more often than red meat. Limit fish, poultry, and meat to 6 oz (170 g) each day.  Limit sweets, desserts, sugars, and sugary drinks.  Choose heart-healthy fats.  Limit cheese to 1 oz (28 g) per day.  Eat more home-cooked food and less restaurant, buffet, and fast food.  Limit fried foods.  Cook foods using methods other than frying.  Limit canned vegetables. If you do use them, rinse them well to decrease the sodium.  When eating at a restaurant, ask that your food be prepared with less salt, or no salt if possible. WHAT FOODS CAN I EAT? Seek help from a dietitian for individual calorie needs. Grains Whole grain or whole wheat bread. Brown rice. Whole grain or whole wheat pasta. Quinoa, bulgur, and whole grain cereals. Low-sodium cereals. Corn or whole wheat flour tortillas. Whole grain cornbread. Whole grain crackers. Low-sodium crackers. Vegetables Fresh or frozen vegetables  (raw, steamed, roasted, or grilled). Low-sodium or reduced-sodium tomato and vegetable juices. Low-sodium or reduced-sodium tomato sauce and paste. Low-sodium or reduced-sodium canned vegetables.  Fruits All fresh, canned (in natural juice), or frozen fruits. Meat and Other Protein Products Ground beef (85% or leaner), grass-fed beef, or beef trimmed of fat. Skinless chicken or turkey. Ground chicken or turkey. Pork trimmed of fat. All fish and seafood. Eggs. Dried beans, peas, or lentils. Unsalted nuts and seeds. Unsalted canned beans. Dairy Low-fat dairy products, such as skim or 1% milk, 2% or reduced-fat cheeses, low-fat ricotta or cottage cheese, or plain low-fat yogurt. Low-sodium or reduced-sodium cheeses. Fats and Oils Tub margarines without trans fats. Light or reduced-fat mayonnaise and salad dressings (reduced sodium). Avocado. Safflower, olive, or canola oils. Natural peanut or almond butter. Other Unsalted popcorn and pretzels. The items listed above may not be a complete list of recommended foods or beverages. Contact your dietitian for more options. WHAT FOODS ARE NOT RECOMMENDED? Grains White bread. White pasta. White rice. Refined cornbread. Bagels and croissants. Crackers that contain trans fat. Vegetables Creamed or fried vegetables. Vegetables in a cheese sauce. Regular canned vegetables. Regular canned tomato sauce and paste. Regular tomato and vegetable juices. Fruits Dried fruits. Canned fruit in light or heavy syrup. Fruit juice. Meat and Other Protein Products Fatty cuts of meat. Ribs, chicken wings, bacon, sausage, bologna, salami, chitterlings, fatback, hot dogs, bratwurst, and packaged luncheon meats. Salted nuts and seeds. Canned beans with salt. Dairy Whole or 2% milk, cream, half-and-half, and cream cheese. Whole-fat or sweetened yogurt. Full-fat   cheeses or blue cheese. Nondairy creamers and whipped toppings. Processed cheese, cheese spreads, or cheese  curds. Condiments Onion and garlic salt, seasoned salt, table salt, and sea salt. Canned and packaged gravies. Worcestershire sauce. Tartar sauce. Barbecue sauce. Teriyaki sauce. Soy sauce, including reduced sodium. Steak sauce. Fish sauce. Oyster sauce. Cocktail sauce. Horseradish. Ketchup and mustard. Meat flavorings and tenderizers. Bouillon cubes. Hot sauce. Tabasco sauce. Marinades. Taco seasonings. Relishes. Fats and Oils Butter, stick margarine, lard, shortening, ghee, and bacon fat. Coconut, palm kernel, or palm oils. Regular salad dressings. Other Pickles and olives. Salted popcorn and pretzels. The items listed above may not be a complete list of foods and beverages to avoid. Contact your dietitian for more information. WHERE CAN I FIND MORE INFORMATION? National Heart, Lung, and Blood Institute: travelstabloid.com Document Released: 01/13/2011 Document Revised: 06/10/2013 Document Reviewed: 11/28/2012 [facility] Patient Information 2015 [place], [place]. This information is not intended to replace advice given to you by your health care provider. Make sure you discuss any questions you have with your health care provider. Back Pain, Adult Low back pain is very common. About 1 in 5 people have back pain.The cause of low back pain is rarely dangerous. The pain often gets better over time.About half of people with a sudden onset of back pain feel better in just 2 weeks. About 8 in 10 people feel better by 6 weeks.  CAUSES Some common causes of back pain include:  Strain of the muscles or ligaments supporting the spine.  Wear and tear (degeneration) of the spinal discs.  Arthritis.  Direct injury to the back. DIAGNOSIS Most of the time, the direct cause of low back pain is not known.However, back pain can be treated effectively even when the exact cause of the pain is unknown.Answering your caregiver's questions about your overall health and symptoms  is one of the most accurate ways to make sure the cause of your pain is not dangerous. If your caregiver needs more information, he or she may order lab work or imaging tests (X-rays or MRIs).However, even if imaging tests show changes in your back, this usually does not require surgery. HOME CARE INSTRUCTIONS For many people, back pain returns.Since low back pain is rarely dangerous, it is often a condition that people can learn to [facility] their own.   Remain active. It is stressful on the back to sit or stand in one place. Do not sit, drive, or stand in one place for more than 30 minutes at a time. Take short walks on level surfaces as soon as pain allows.Try to increase the length of time you walk each day.  Do not stay in bed.Resting more than 1 or 2 days can delay your recovery.  Do not avoid exercise or work.Your body is made to move.It is not dangerous to be active, even though your back may hurt.Your back will likely heal faster if you return to being active before your pain is gone.  Pay attention to your body when you bend and lift. Many people have less discomfortwhen lifting if they bend their knees, keep the load close to their bodies,and avoid twisting. Often, the most comfortable positions are those that put less stress on your recovering back.  Find a comfortable position to sleep. Use a firm mattress and lie on your side with your knees slightly bent. If you lie on your back, put a pillow under your knees.  Only take over-the-counter or prescription medicines as directed by your caregiver. Over-the-counter medicines to  reduce pain and inflammation are often the most helpful.Your caregiver may prescribe muscle relaxant drugs.These medicines help dull your pain so you can more quickly return to your normal activities and healthy exercise.  Put ice on the injured area.  Put ice in a plastic bag.  Place a towel between your skin and the bag.  Leave the ice on for  15-20 minutes, 03-04 times a day for the first 2 to 3 days. After that, ice and heat may be alternated to reduce pain and spasms.  Ask your caregiver about trying back exercises and gentle massage. This may be of some benefit.  Avoid feeling anxious or stressed.Stress increases muscle tension and can worsen back pain.It is important to recognize when you are anxious or stressed and learn ways to manage it.Exercise is a great option. SEEK MEDICAL CARE IF:  You have pain that is not relieved with rest or medicine.  You have pain that does not improve in 1 week.  You have new symptoms.  You are generally not feeling well. SEEK IMMEDIATE MEDICAL CARE IF:   You have pain that radiates from your back into your legs.  You develop new bowel or bladder control problems.  You have unusual weakness or numbness in your arms or legs.  You develop nausea or vomiting.  You develop abdominal pain.  You feel faint. Document Released: 01/24/2005 Document Revised: 07/26/2011 Document Reviewed: 05/28/2013 [facility] Patient Information 2015 [place], [place]. This information is not intended to replace advice given to you by your health care provider. Make sure you discuss any questions you have with your health care provider. Hypertension Hypertension, commonly called high blood pressure, is when the force of blood pumping through your arteries is too strong. Your arteries are the blood vessels that carry blood from your heart throughout your body. A blood pressure reading consists of a higher number over a lower number, such as 110/72. The higher number (systolic) is the pressure inside your arteries when your heart pumps. The lower number (diastolic) is the pressure inside your arteries when your heart relaxes. Ideally you want your blood pressure below 120/80. Hypertension forces your heart to work harder to pump blood. Your arteries may become narrow or stiff. Having hypertension puts you at risk for  heart disease, stroke, and other problems.  RISK FACTORS Some risk factors for high blood pressure are controllable. Others are not.  Risk factors you cannot control include:   Race. You may be at higher risk if you are African American.  Age. Risk increases with age.  Gender. Men are at higher risk than women before age 39 years. After age 6, women are at higher risk than men. Risk factors you can control include:  Not getting enough exercise or physical activity.  Being overweight.  Getting too much fat, sugar, calories, or salt in your diet.  Drinking too much alcohol. SIGNS AND SYMPTOMS Hypertension does not usually cause signs or symptoms. Extremely high blood pressure (hypertensive crisis) may cause headache, anxiety, shortness of breath, and nosebleed. DIAGNOSIS  To check if you have hypertension, your health care provider will measure your blood pressure while you are seated, with your arm held at the level of your heart. It should be measured at least twice using the same arm. Certain conditions can cause a difference in blood pressure between your right and left arms. A blood pressure reading that is higher than normal on one occasion does not mean that you need treatment. If one blood  pressure reading is high, ask your health care provider about having it checked again. TREATMENT  Treating high blood pressure includes making lifestyle changes and possibly taking medicine. Living a healthy lifestyle can help lower high blood pressure. You may need to change some of your habits. Lifestyle changes may include:  Following the DASH diet. This diet is high in fruits, vegetables, and whole grains. It is low in salt, red meat, and added sugars.  Getting at least 2 hours of brisk physical activity every week.  Losing weight if necessary.  Not smoking.  Limiting alcoholic beverages.  Learning ways to reduce stress. If lifestyle changes are not enough to get your blood  pressure under control, your health care provider may prescribe medicine. You may need to take more than one. Work closely with your health care provider to understand the risks and benefits. HOME CARE INSTRUCTIONS  Have your blood pressure rechecked as directed by your health care provider.   Take medicines only as directed by your health care provider. Follow the directions carefully. Blood pressure medicines must be taken as prescribed. The medicine does not work as well when you skip doses. Skipping doses also puts you at risk for problems.   Do not smoke.   Monitor your blood pressure at home as directed by your health care provider. SEEK MEDICAL CARE IF:   You think you are having a reaction to medicines taken.  You have recurrent headaches or feel dizzy.  You have swelling in your ankles.  You have trouble with your vision. SEEK IMMEDIATE MEDICAL CARE IF:  You develop a severe headache or confusion.  You have unusual weakness, numbness, or feel faint.  You have severe chest or abdominal pain.  You vomit repeatedly.  You have trouble breathing. MAKE SURE YOU:   Understand these instructions.  Will watch your condition.  Will get help right away if you are not doing well or get worse. Document Released: 01/24/2005 Document Revised: 06/10/2013 Document Reviewed: 11/16/2012 [facility] Patient Information 2015 [place], [place]. This information is not intended to replace advice given to you by your health care provider. Make sure you discuss any questions you have with your health care provider.

## 2013-09-09 NOTE — Progress Notes (Signed)
Patient ID: Carol Merritt, female   DOB: 23-Aug-1950, 63 y.o.   MRN: [id number]   Carol Merritt, is a 63 y.o. female  NAT:[id number]  KYH:[id number]  DOB - 1950-06-08  Chief Complaint  Patient presents with  . Follow-up        Subjective:   Carol Merritt is a 63 y.o. female here today for a follow up visit. Patient has history of hypertension and osteoarthritis of both knees mostly on the right, here today for routine followup of hypertension. 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 She does not have any other complaint, she claims compliant with her medications, currently on lisinopril-hydrochlorothiazide 10-12.5 mg tablet by mouth daily, but blood pressure remains uncontrolled. She doesn't have headache, nor blurry vision, denies chest pain. She has not had colonoscopy, she is due for mammogram and Pap smear. The patient's would prefer to call back for an annual physical examination, says that academic session starts soon as she does not want to miss work because of tests. She continue to smoke every day about half a pack per day and drinks alcohol occasionally. Patient has No headache, No chest pain, No abdominal pain - No Nausea, No new weakness tingling or numbness, No Cough - SOB.  Problem  Primary Osteoarthritis of Right Knee  Bilateral Low Back Pain Without Sciatica    ALLERGIES: No Known Allergies  PAST MEDICAL HISTORY: Past Medical History  Diagnosis Date  . Arthritis   . Hypertension     MEDICATIONS AT HOME: Prior to Admission medications   Medication Sig Start Date End Date Taking? Authorizing Provider  amoxicillin (AMOXIL) 500 MG capsule Take 2 capsules (1,000 mg total) by mouth 2 (two) times daily. 12/27/12   Janne Napoleon, NP  calcium-vitamin D (OSCAL WITH D) 500-200 MG-UNIT per tablet Take 1 tablet by mouth.    Historical Provider, MD  celecoxib (CELEBREX) 100 MG capsule Take 1 capsule (100 mg total) by mouth  2 (two) times daily. 09/26/12   Larene Pickett, PA-C  cyclobenzaprine (FLEXERIL) 10 MG tablet Take 1 tablet (10 mg total) by mouth 3 (three) times daily as needed for muscle spasms. 09/09/13   Angelica Chessman, MD  HYDROcodone-acetaminophen (NORCO) 5-325 MG per tablet Take 1 tablet by mouth every 6 (six) hours as needed for moderate pain. 12/27/12   Janne Napoleon, NP  ibuprofen (ADVIL,MOTRIN) 200 MG tablet Take 400 mg by mouth every 6 (six) hours as needed for pain.    Historical Provider, MD  lisinopril-hydrochlorothiazide (ZESTORETIC) 20-12.5 MG per tablet Take 1 tablet by mouth daily. 09/09/13   Angelica Chessman, MD  traMADol (ULTRAM) 50 MG tablet Take 1 tablet (50 mg total) by mouth every 6 (six) hours as needed. 09/09/13   Angelica Chessman, MD     Objective:   Filed Vitals:   09/09/13 1103  BP: 166/77  Pulse: 60  Temp: 99.1 F (37.3 C)  TempSrc: Oral  Resp: 16  Height: 5\' 6"  (1.676 m)  Weight: 148 lb (67.132 kg)  SpO2: 100%    Exam General appearance : Awake, alert, not in any distress. Speech Clear. Not toxic looking HEENT: Atraumatic and Normocephalic, pupils equally reactive to light and accomodation Neck: supple, no JVD. No cervical lymphadenopathy.  Chest:Good air entry bilaterally, no added sounds  CVS: S1 S2 regular, no murmurs.  Abdomen: Bowel sounds present, Non tender and not distended with no gaurding, rigidity or rebound. Extremities: B/L Lower Ext  shows no edema, both legs are warm to touch Neurology: Awake alert, and oriented X 3, CN II-XII intact, Non focal Skin:No Rash Wounds:N/A  Data Review No results found for this basename: HGBA1C     Assessment & Plan   1. Primary osteoarthritis of right knee Pain medications as prescribed Patient was counseled extensively  2. Uncontrolled hypertension Increase lisinopril/hydrochlorothiazide to 20-12.5 mg tablet from 10-12.5 mg Prescribed - lisinopril-hydrochlorothiazide (ZESTORETIC) 20-12.5 MG per tablet; Take 1  tablet by mouth daily.  Dispense: 90 tablet; Refill: 3  - CBC with Differential - COMPLETE METABOLIC PANEL WITH GFR - POCT glycosylated hemoglobin (Hb A1C) - Lipid panel - Urinalysis, Complete  3. Bilateral low back pain without sciatica  - traMADol (ULTRAM) 50 MG tablet; Take 1 tablet (50 mg total) by mouth every 6 (six) hours as needed.  Dispense: 60 tablet; Refill: 0 - cyclobenzaprine (FLEXERIL) 10 MG tablet; Take 1 tablet (10 mg total) by mouth 3 (three) times daily as needed for muscle spasms.  Dispense: 60 tablet; Refill: 3   Patient was counseled on nutrition and exercise Patient was extensively counseled on smoking cessation  Return in about 3 months (around 12/10/2013), or if symptoms worsen or fail to improve, for Follow up HTN, Annual Physical.  The patient was given clear instructions to go to ER or return to medical center if symptoms don't improve, worsen or new problems develop. The patient verbalized understanding. The patient was told to call to get lab results if they haven't heard anything in the next week.   This note has been created with Surveyor, quantity. Any transcriptional errors are unintentional.    Angelica Chessman, MD, [place], [place], [place] and [place], [place]   09/09/2013, 12:03 PM

## 2013-09-10 LAB — URINALYSIS, COMPLETE
BACTERIA UA: NONE SEEN
Bilirubin Urine: NEGATIVE
CRYSTALS: NONE SEEN
Casts: NONE SEEN
Glucose, UA: NEGATIVE mg/dL
Hgb urine dipstick: NEGATIVE
Ketones, ur: NEGATIVE mg/dL
LEUKOCYTES UA: NEGATIVE
Nitrite: NEGATIVE
PH: 6.5 (ref 5.0–8.0)
PROTEIN: NEGATIVE mg/dL
Specific Gravity, Urine: 1.005 (ref 1.005–1.030)
Squamous Epithelial / LPF: NONE SEEN
Urobilinogen, UA: 0.2 mg/dL (ref 0.0–1.0)

## 2013-09-16 ENCOUNTER — Telehealth: Payer: Self-pay | Admitting: Emergency Medicine

## 2013-09-16 MED ORDER — SIMVASTATIN 10 MG PO TABS: 10.0000 mg | ORAL_TABLET | Freq: Every day | ORAL | Status: DC

## 2013-09-16 NOTE — Telephone Encounter (Signed)
Message copied by Ricci Barker on Mon Sep 16, 2013  3:42 PM ------      Message from: Tresa Garter      Created: Sun Sep 15, 2013  9:25 PM       Please inform patient that her laboratory tests results are mostly within normal limit except for a blister that has gone up a little bit. We will start her on a low-dose cholesterol medicine and check again in 3-6 months            Please call in prescription simvastatin 10 mg tablet by mouth daily 90 tablets with 3 refills ------

## 2013-09-16 NOTE — Telephone Encounter (Signed)
Attempted to give pt lab results with medication instructions;number listed doesn't accept incoming calls Medication Simvastatin 10 mg tab e-scribed to pharmacy

## 2014-09-26 ENCOUNTER — Telehealth: Payer: Self-pay | Admitting: Internal Medicine

## 2014-09-26 DIAGNOSIS — I10 Essential (primary) hypertension: Secondary | ICD-10-CM

## 2014-09-26 MED ORDER — LISINOPRIL-HYDROCHLOROTHIAZIDE 20-12.5 MG PO TABS: 1.0000 | ORAL_TABLET | Freq: Every day | ORAL | Status: DC

## 2014-09-26 NOTE — Telephone Encounter (Signed)
Patient called requesting medication refill on lisinopril-hydrochlorothiazide (ZESTORETIC) 20-12.5 MG per tablet. Patient would like 30 day supply until she can get an appt with PCP. Pt states she took her last pill today . Please f/u

## 2014-09-26 NOTE — Telephone Encounter (Signed)
Patient requesting refill on lisinopril.  Patient has not been seen for 1 year.  Her appointment with Dr. Doreene Burke is on 11/13/14.  Patient given supply of lisinopril to last her until her appointment.  Patient verbalized understanding.

## 2014-11-13 ENCOUNTER — Encounter: Payer: Self-pay | Admitting: Internal Medicine

## 2014-11-13 ENCOUNTER — Ambulatory Visit: Payer: Self-pay | Attending: Internal Medicine | Admitting: Internal Medicine

## 2014-11-13 VITALS — BP 136/61 | HR 61 | Temp 97.8°F | Resp 18 | Ht 66.0 in | Wt 142.0 lb

## 2014-11-13 DIAGNOSIS — M25551 Pain in right hip: Secondary | ICD-10-CM | POA: Insufficient documentation

## 2014-11-13 DIAGNOSIS — M25559 Pain in unspecified hip: Secondary | ICD-10-CM | POA: Insufficient documentation

## 2014-11-13 DIAGNOSIS — M545 Low back pain, unspecified: Secondary | ICD-10-CM

## 2014-11-13 DIAGNOSIS — I10 Essential (primary) hypertension: Secondary | ICD-10-CM | POA: Insufficient documentation

## 2014-11-13 DIAGNOSIS — Z0000 Encounter for general adult medical examination without abnormal findings: Secondary | ICD-10-CM

## 2014-11-13 DIAGNOSIS — D367 Benign neoplasm of other specified sites: Secondary | ICD-10-CM | POA: Insufficient documentation

## 2014-11-13 DIAGNOSIS — E785 Hyperlipidemia, unspecified: Secondary | ICD-10-CM | POA: Insufficient documentation

## 2014-11-13 DIAGNOSIS — D2330 Other benign neoplasm of skin of unspecified part of face: Secondary | ICD-10-CM | POA: Insufficient documentation

## 2014-11-13 LAB — CBC WITH DIFFERENTIAL/PLATELET
Basophils Absolute: 0 10*3/uL (ref 0.0–0.1)
Basophils Relative: 0 % (ref 0–1)
EOS PCT: 3 % (ref 0–5)
Eosinophils Absolute: 0.2 10*3/uL (ref 0.0–0.7)
HEMATOCRIT: 38.1 % (ref 36.0–46.0)
HEMOGLOBIN: 13 g/dL (ref 12.0–15.0)
LYMPHS PCT: 46 % (ref 12–46)
Lymphs Abs: 3.5 10*3/uL (ref 0.7–4.0)
MCH: 31.6 pg (ref 26.0–34.0)
MCHC: 34.1 g/dL (ref 30.0–36.0)
MCV: 92.7 fL (ref 78.0–100.0)
MONO ABS: 0.5 10*3/uL (ref 0.1–1.0)
MONOS PCT: 7 % (ref 3–12)
MPV: 11 fL (ref 8.6–12.4)
NEUTROS ABS: 3.3 10*3/uL (ref 1.7–7.7)
Neutrophils Relative %: 44 % (ref 43–77)
Platelets: 294 10*3/uL (ref 150–400)
RBC: 4.11 MIL/uL (ref 3.87–5.11)
RDW: 13.9 % (ref 11.5–15.5)
WBC: 7.5 10*3/uL (ref 4.0–10.5)

## 2014-11-13 LAB — COMPLETE METABOLIC PANEL WITH GFR
ALBUMIN: 4.7 g/dL (ref 3.6–5.1)
ALK PHOS: 101 U/L (ref 33–130)
ALT: 17 U/L (ref 6–29)
AST: 24 U/L (ref 10–35)
BILIRUBIN TOTAL: 0.5 mg/dL (ref 0.2–1.2)
BUN: 13 mg/dL (ref 7–25)
CALCIUM: 10.5 mg/dL — AB (ref 8.6–10.4)
CO2: 30 mmol/L (ref 20–31)
Chloride: 103 mmol/L (ref 98–110)
Creat: 0.63 mg/dL (ref 0.50–0.99)
GLUCOSE: 101 mg/dL — AB (ref 65–99)
POTASSIUM: 4.1 mmol/L (ref 3.5–5.3)
Sodium: 139 mmol/L (ref 135–146)
TOTAL PROTEIN: 7.4 g/dL (ref 6.1–8.1)

## 2014-11-13 LAB — POCT URINALYSIS DIPSTICK
BILIRUBIN UA: NEGATIVE
Blood, UA: NEGATIVE
Glucose, UA: NEGATIVE
KETONES UA: NEGATIVE
NITRITE UA: NEGATIVE
PH UA: 6
PROTEIN UA: NEGATIVE
Spec Grav, UA: 1.01
Urobilinogen, UA: 0.2

## 2014-11-13 LAB — LIPID PANEL
Cholesterol: 244 mg/dL — ABNORMAL HIGH (ref 125–200)
HDL: 89 mg/dL (ref >=46)
LDL CALC: 139 mg/dL — AB (ref <130)
TRIGLYCERIDES: 79 mg/dL (ref <150)
Total CHOL/HDL Ratio: 2.7 Ratio (ref <=5.0)
VLDL: 16 mg/dL (ref <30)

## 2014-11-13 LAB — TSH: TSH: 0.811 u[IU]/mL (ref 0.350–4.500)

## 2014-11-13 LAB — POCT GLYCOSYLATED HEMOGLOBIN (HGB A1C): HEMOGLOBIN A1C: 5.8

## 2014-11-13 MED ORDER — SIMVASTATIN 10 MG PO TABS: 10.0000 mg | ORAL_TABLET | Freq: Every day | ORAL | Status: DC

## 2014-11-13 MED ORDER — LISINOPRIL-HYDROCHLOROTHIAZIDE 20-12.5 MG PO TABS: 1.0000 | ORAL_TABLET | Freq: Every day | ORAL | Status: DC

## 2014-11-13 MED ORDER — CELECOXIB 100 MG PO CAPS: 100.0000 mg | ORAL_CAPSULE | Freq: Two times a day (BID) | ORAL | Status: AC

## 2014-11-13 MED ORDER — CYCLOBENZAPRINE HCL 10 MG PO TABS: 10.0000 mg | ORAL_TABLET | Freq: Three times a day (TID) | ORAL | Status: AC | PRN

## 2014-11-13 MED ORDER — TRAMADOL HCL 50 MG PO TABS: 50.0000 mg | ORAL_TABLET | Freq: Four times a day (QID) | ORAL | Status: AC | PRN

## 2014-11-13 NOTE — Patient Instructions (Signed)
Back Pain, Adult °Back pain is very common in adults. The cause of back pain is rarely dangerous and the pain often gets better over time. The cause of your back pain may not be known. Some common causes of back pain include: °· Strain of the muscles or ligaments supporting the spine. °· Wear and tear (degeneration) of the spinal disks. °· Arthritis. °· Direct injury to the back. °For many people, back pain may return. Since back pain is rarely dangerous, most people can learn to manage this condition on their own. °HOME CARE INSTRUCTIONS °Watch your back pain for any changes. The following actions may help to lessen any discomfort you are feeling: °· Remain active. It is stressful on your back to sit or stand in one place for long periods of time. Do not sit, drive, or stand in one place for more than 30 minutes at a time. Take short walks on even surfaces as soon as you are able. Try to increase the length of time you walk each day. °· Exercise regularly as directed by your health care provider. Exercise helps your back heal faster. It also helps avoid future injury by keeping your muscles strong and flexible. °· Do not stay in bed. Resting more than 1-2 days can delay your recovery. °· Pay attention to your body when you bend and lift. The most comfortable positions are those that put less stress on your recovering back. Always use proper lifting techniques, including: °· Bending your knees. °· Keeping the load close to your body. °· Avoiding twisting. °· Find a comfortable position to sleep. Use a firm mattress and lie on your side with your knees slightly bent. If you lie on your back, put a pillow under your knees. °· Avoid feeling anxious or stressed. Stress increases muscle tension and can worsen back pain. It is important to recognize when you are anxious or stressed and learn ways to manage it, such as with exercise. °· Take medicines only as directed by your health care provider. Over-the-counter  medicines to reduce pain and inflammation are often the most helpful. Your health care provider may prescribe muscle relaxant drugs. These medicines help dull your pain so you can more quickly return to your normal activities and healthy exercise. °· Apply ice to the injured area: °· Put ice in a plastic bag. °· Place a towel between your skin and the bag. °· Leave the ice on for 20 minutes, 2-3 times a day for the first 2-3 days. After that, ice and heat may be alternated to reduce pain and spasms. °· Maintain a healthy weight. Excess weight puts extra stress on your back and makes it difficult to maintain good posture. °SEEK MEDICAL CARE IF: °· You have pain that is not relieved with rest or medicine. °· You have increasing pain going down into the legs or buttocks. °· You have pain that does not improve in one week. °· You have night pain. °· You lose weight. °· You have a fever or chills. °SEEK IMMEDIATE MEDICAL CARE IF:  °· You develop new bowel or bladder control problems. °· You have unusual weakness or numbness in your arms or legs. °· You develop nausea or vomiting. °· You develop abdominal pain. °· You feel faint. °  °This information is not intended to replace advice given to you by your health care provider. Make sure you discuss any questions you have with your health care provider. °  °Document Released: 01/24/2005 Document Revised: 02/14/2014 Document Reviewed: 05/28/2013 °Elsevier Interactive Patient Education ©2016 Elsevier   Inc[employer] DASH Eating Plan DASH stands for "Dietary Approaches to Stop Hypertension." The DASH eating plan is a healthy eating plan that has been shown to reduce high blood pressure (hypertension). Additional health benefits may include reducing the risk of type 2 diabetes mellitus, heart disease, and stroke. The DASH eating plan may also help with weight loss. WHAT DO I NEED TO KNOW ABOUT THE DASH EATING PLAN? For the DASH eating plan, you will follow these general  guidelines:  Choose foods with a percent daily value for sodium of less than 5% (as listed on the food label).  Use salt-free seasonings or herbs instead of table salt or sea salt.  Check with your health care provider or pharmacist before using salt substitutes.  Eat lower-sodium products, often labeled as "lower sodium" or "no salt added."  Eat fresh foods.  Eat more vegetables, fruits, and low-fat dairy products.  Choose whole grains. Look for the word "whole" as the first word in the ingredient list.  Choose fish and skinless chicken or [place] more often than red meat. Limit fish, poultry, and meat to 6 oz (170 g) each day.  Limit sweets, desserts, sugars, and sugary drinks.  Choose heart-healthy fats.  Limit cheese to 1 oz (28 g) per day.  Eat more home-cooked food and less restaurant, buffet, and fast food.  Limit fried foods.  Cook foods using methods other than frying.  Limit canned vegetables. If you do use them, rinse them well to decrease the sodium.  When eating at a restaurant, ask that your food be prepared with less salt, or no salt if possible. WHAT FOODS CAN I EAT? Seek help from a dietitian for individual calorie needs. Grains Whole grain or whole wheat bread. Brown rice. Whole grain or whole wheat pasta. Quinoa, bulgur, and whole grain cereals. Low-sodium cereals. Corn or whole wheat flour tortillas. Whole grain cornbread. Whole grain crackers. Low-sodium crackers. Vegetables Fresh or frozen vegetables (raw, steamed, roasted, or grilled). Low-sodium or reduced-sodium tomato and vegetable juices. Low-sodium or reduced-sodium tomato sauce and paste. Low-sodium or reduced-sodium canned vegetables.  Fruits All fresh, canned (in natural juice), or frozen fruits. Meat and Other Protein Products Ground beef (85% or leaner), grass-fed beef, or beef trimmed of fat. Skinless chicken or [place]. Ground chicken or [place]. Pork trimmed of fat. All fish and seafood.  Eggs. Dried beans, peas, or lentils. Unsalted nuts and seeds. Unsalted canned beans. Dairy Low-fat dairy products, such as skim or 1% milk, 2% or reduced-fat cheeses, low-fat ricotta or cottage cheese, or plain low-fat yogurt. Low-sodium or reduced-sodium cheeses. Fats and Oils Tub margarines without trans fats. Light or reduced-fat mayonnaise and salad dressings (reduced sodium). Avocado. Safflower, olive, or canola oils. Natural peanut or almond butter. Other Unsalted popcorn and pretzels. The items listed above may not be a complete list of recommended foods or beverages. Contact your dietitian for more options. WHAT FOODS ARE NOT RECOMMENDED? Grains White bread. White pasta. White rice. Refined cornbread. Bagels and croissants. Crackers that contain trans fat. Vegetables Creamed or fried vegetables. Vegetables in a cheese sauce. Regular canned vegetables. Regular canned tomato sauce and paste. Regular tomato and vegetable juices. Fruits Dried fruits. Canned fruit in light or heavy syrup. Fruit juice. Meat and Other Protein Products Fatty cuts of meat. Ribs, chicken wings, bacon, sausage, bologna, salami, chitterlings, fatback, hot dogs, bratwurst, and packaged luncheon meats. Salted nuts and seeds. Canned beans with salt. Dairy Whole or 2% milk, cream, half-and-half, and cream cheese. Whole-fat or sweetened  yogurt. Full-fat cheeses or blue cheese. Nondairy creamers and whipped toppings. Processed cheese, cheese spreads, or cheese curds. Condiments Onion and garlic salt, seasoned salt, table salt, and sea salt. Canned and packaged gravies. Worcestershire sauce. Tartar sauce. Barbecue sauce. Teriyaki sauce. Soy sauce, including reduced sodium. Steak sauce. Fish sauce. Oyster sauce. Cocktail sauce. Horseradish. Ketchup and mustard. Meat flavorings and tenderizers. Bouillon cubes. Hot sauce. Tabasco sauce. Marinades. Taco seasonings. Relishes. Fats and Oils Butter, stick margarine, lard,  shortening, ghee, and bacon fat. Coconut, palm kernel, or palm oils. Regular salad dressings. Other Pickles and olives. Salted popcorn and pretzels. The items listed above may not be a complete list of foods and beverages to avoid. Contact your dietitian for more information. WHERE CAN I FIND MORE INFORMATION? National Heart, Lung, and Blood Institute: travelstabloid.com   This information is not intended to replace advice given to you by your health care provider. Make sure you discuss any questions you have with your health care provider.   Document Released: 01/13/2011 Document Revised: 02/14/2014 Document Reviewed: 11/28/2012 Elsevier Interactive Patient Education 2016 [employer]. Hypertension Hypertension, commonly called high blood pressure, is when the force of blood pumping through your arteries is too strong. Your arteries are the blood vessels that carry blood from your heart throughout your body. A blood pressure reading consists of a higher number over a lower number, such as 110/72. The higher number (systolic) is the pressure inside your arteries when your heart pumps. The lower number (diastolic) is the pressure inside your arteries when your heart relaxes. Ideally you want your blood pressure below 120/80. Hypertension forces your heart to work harder to pump blood. Your arteries may become narrow or stiff. Having untreated or uncontrolled hypertension can cause heart attack, stroke, kidney disease, and other problems. RISK FACTORS Some risk factors for high blood pressure are controllable. Others are not.  Risk factors you cannot control include:   Race. You may be at higher risk if you are African American.  Age. Risk increases with age.  Gender. Men are at higher risk than women before age 16 years. After age 86, women are at higher risk than men. Risk factors you can control include:  Not getting enough exercise or physical  activity.  Being overweight.  Getting too much fat, sugar, calories, or salt in your diet.  Drinking too much alcohol. SIGNS AND SYMPTOMS Hypertension does not usually cause signs or symptoms. Extremely high blood pressure (hypertensive crisis) may cause headache, anxiety, shortness of breath, and nosebleed. DIAGNOSIS To check if you have hypertension, your health care provider will measure your blood pressure while you are seated, with your arm held at the level of your heart. It should be measured at least twice using the same arm. Certain conditions can cause a difference in blood pressure between your right and left arms. A blood pressure reading that is higher than normal on one occasion does not mean that you need treatment. If it is not clear whether you have high blood pressure, you may be asked to return on a different day to have your blood pressure checked again. Or, you may be asked to monitor your blood pressure at home for 1 or more weeks. TREATMENT Treating high blood pressure includes making lifestyle changes and possibly taking medicine. Living a healthy lifestyle can help lower high blood pressure. You may need to change some of your habits. Lifestyle changes may include:  Following the DASH diet. This diet is high in fruits, vegetables, and  whole grains. It is low in salt, red meat, and added sugars.  Keep your sodium intake below 2,300 mg per day.  Getting at least 30-45 minutes of aerobic exercise at least 4 times per week.  Losing weight if necessary.  Not smoking.  Limiting alcoholic beverages.  Learning ways to reduce stress. Your health care provider may prescribe medicine if lifestyle changes are not enough to get your blood pressure under control, and if one of the following is true:  You are 46-31 years of age and your systolic blood pressure is above 140.  You are 20 years of age or older, and your systolic blood pressure is above 150.  Your diastolic  blood pressure is above 90.  You have diabetes, and your systolic blood pressure is over 053 or your diastolic blood pressure is over 90.  You have kidney disease and your blood pressure is above 140/90.  You have heart disease and your blood pressure is above 140/90. Your personal target blood pressure may vary depending on your medical conditions, your age, and other factors. HOME CARE INSTRUCTIONS  Have your blood pressure rechecked as directed by your health care provider.   Take medicines only as directed by your health care provider. Follow the directions carefully. Blood pressure medicines must be taken as prescribed. The medicine does not work as well when you skip doses. Skipping doses also puts you at risk for problems.  Do not smoke.   Monitor your blood pressure at home as directed by your health care provider. SEEK MEDICAL CARE IF:   You think you are having a reaction to medicines taken.  You have recurrent headaches or feel dizzy.  You have swelling in your ankles.  You have trouble with your vision. SEEK IMMEDIATE MEDICAL CARE IF:  You develop a severe headache or confusion.  You have unusual weakness, numbness, or feel faint.  You have severe chest or abdominal pain.  You vomit repeatedly.  You have trouble breathing. MAKE SURE YOU:   Understand these instructions.  Will watch your condition.  Will get help right away if you are not doing well or get worse.   This information is not intended to replace advice given to you by your health care provider. Make sure you discuss any questions you have with your health care provider.   Document Released: 01/24/2005 Document Revised: 06/10/2014 Document Reviewed: 11/16/2012 Elsevier Interactive Patient Education [employer].

## 2014-11-13 NOTE — Progress Notes (Signed)
Subjective:    Patient ID: Carol Merritt, female    DOB: [birth year], 64 y.o.   MRN: [id number]  HPI Carol Merritt is 64 year old female in today for HTN follow up. Patient significant for HTN, Osteoarthritis of right knee, and low back pain without sciatica and new onset right hip pain. Patient states pain is consistent, sharp, limits her mobility and rates pain as 10 of 10 periodically. Patient has been taking otc Ibuprofen with only moderate relief. No chest pain, abdominal pain, no nausea, no new weakness or tingling, no sob.  Patient states she has nagging cough on occasion -?Ace Inhibitor. Patient does not wish to change medication at this time. Patient continues to smoke 3 cigarettes a day, does not want smoking cessation information at this time.   Review of Systems  Constitutional: Negative.   Respiratory: Positive for cough.   Cardiovascular: Negative.   Gastrointestinal: Negative.   Genitourinary: Negative.   Musculoskeletal: Positive for back pain and gait problem.  Skin: Negative.   Hematological: Negative.   Psychiatric/Behavioral: Negative.        Objective:   Physical Exam  Constitutional: She is oriented to person, place, and time. She appears well-developed and well-nourished.  HENT:  Head: Normocephalic and atraumatic.  Right Ear: External ear normal.  Left Ear: External ear normal.  Nose: Nose normal.  Mouth/Throat: Oropharynx is clear and moist.  Eyes: EOM are normal. Pupils are equal, round, and reactive to light.  Neck: Normal range of motion. Neck supple.  Cardiovascular: Normal rate, regular rhythm, normal heart sounds and intact distal pulses.   Pulmonary/Chest: Effort normal and breath sounds normal.  Abdominal: Soft. Bowel sounds are normal.  Musculoskeletal: Normal range of motion.  Patient has limited ROM and pain in right hip. Low back pain  Neurological: She is alert and oriented to person, place, and time.  Skin: Skin is warm and  dry.  Dermatoid cyst - right temporal  Psychiatric: She has a normal mood and affect. Her behavior is normal. Judgment and thought content normal.      Assessment & Plan:     Carol Merritt was seen today for hypertension.  Diagnoses and all orders for this visit:  Essential hypertension -     COMPLETE METABOLIC PANEL WITH GFR -     Urinalysis Dipstick -     lisinopril-hydrochlorothiazide (ZESTORETIC) 20-12.5 MG tablet; Take 1 tablet by mouth daily.  We have discussed target BP range and blood pressure goal. I have advised patient to check BP regularly and to call us back or report to clinic if the numbers are consistently higher than 140/90. We discussed the importance of compliance with medical therapy and DASH diet recommended, consequences of uncontrolled hypertension discussed.   Bilateral low back pain without sciatica -     celecoxib (CELEBREX) 100 MG capsule; Take 1 capsule (100 mg total) by mouth 2 (two) times daily. -     cyclobenzaprine (FLEXERIL) 10 MG tablet; Take 1 tablet (10 mg total) by mouth 3 (three) times daily as needed for muscle spasms. -        Pain in hip joint, right -     traMADol (ULTRAM) 50 MG tablet; Take 1 tablet (50 mg total) by mouth every 6 (six) hours as needed. -     celecoxib (CELEBREX) 100 MG capsule; Take 1 capsule (100 mg total) by mouth 2 (two) times daily.  Dermoid cyst of face -     Ambulatory referral to Dermatology  Preventative health care -     CBC with Differential -     TSH -     COMPLETE METABOLIC PANEL WITH GFR -     HgB A1c -     Ambulatory referral to Gastroenterology - Screening Colonoscopy -     MM DIGITAL SCREENING BILATERAL; Future       -     Patient was counseled extensively about diet and exercise, and the importance of smoking cessation. Patient refused influenza vaccine and tetanus shot today.  Dyslipidemia -     Lipid Panel -     simvastatin (ZOCOR) 10 MG tablet; Take 1 tablet (10 mg total) by mouth at bedtime. -      To address this please limit saturated fat to no more than 7% of your calories, limit cholesterol to 200 mg/day, increase fiber and exercise as tolerated. If needed we may add another cholesterol lowering medication to your regimen.   The patient was given clear instructions to go to ER or return to medical center if symptoms don't improve, worsen or new problems develop. The patient verbalized understanding. The patient was told to call to get lab results if they haven't heard anything in the next week.   Clois Dupes, RN, BSN, [place] 11/13/2014 11:56 AM  Evaluation and management procedures were performed by the Advanced Practitioner under my supervision and collaboration. I have reviewed the Advanced Practitioner's note and chart, and I agree with the management and plan.  Angelica Chessman, MD, [place], [place], [place], [place] and [facility] [place], [place]

## 2014-11-13 NOTE — Progress Notes (Signed)
Patient here for HTN F/U.  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 Scaled at an 8.  Patient requesting pain medication for pain. Patient has knot on right side of forehead, would like Doctor to take a look at site.  Patient has taken medication this morning.

## 2014-11-24 ENCOUNTER — Telehealth: Payer: Self-pay | Admitting: Internal Medicine

## 2014-11-24 NOTE — Telephone Encounter (Signed)
Patient verified DOB Patient states she moved and does not know where her BP medications are.

## 2014-11-24 NOTE — Telephone Encounter (Signed)
Patient called and requested a med refill for her blood pressure medications, Patient stated that she recently moved and lost the medications.  Please f/u

## 2014-12-03 NOTE — Telephone Encounter (Signed)
Patient is aware of results.

## 2014-12-03 NOTE — Telephone Encounter (Signed)
-----   Message from Tresa Garter, MD sent at 11/21/2014  6:53 PM EDT ----- Please inform patient that her laboratory test results are mostly within normal limits except for cholesterol that is very high. Advised patient to continue her cholesterol medication and also to address this please limit saturated fat to no more than 7% of your calories, limit cholesterol to 200 mg/day, increase fiber and exercise as tolerated. If needed we may increase her cholesterol lowering medication.

## 2015-01-12 ENCOUNTER — Telehealth: Payer: Self-pay | Admitting: Internal Medicine

## 2015-01-12 NOTE — Telephone Encounter (Signed)
Patient called and requested a med refill on her Lisinopril and Tylenol #3. Patient needs enough to last until her next appointment.  Please f/u with pt.

## 2015-01-13 ENCOUNTER — Other Ambulatory Visit: Payer: Self-pay | Admitting: *Deleted

## 2015-01-13 DIAGNOSIS — I10 Essential (primary) hypertension: Secondary | ICD-10-CM

## 2015-01-13 MED ORDER — LISINOPRIL-HYDROCHLOROTHIAZIDE 20-12.5 MG PO TABS: 1.0000 | ORAL_TABLET | Freq: Every day | ORAL | Status: DC

## 2015-02-19 DIAGNOSIS — I10 Essential (primary) hypertension: Secondary | ICD-10-CM | POA: Diagnosis not present

## 2015-02-19 DIAGNOSIS — Z6823 Body mass index (BMI) 23.0-23.9, adult: Secondary | ICD-10-CM | POA: Diagnosis not present

## 2015-02-19 DIAGNOSIS — Z1389 Encounter for screening for other disorder: Secondary | ICD-10-CM | POA: Diagnosis not present

## 2015-03-05 ENCOUNTER — Encounter (HOSPITAL_COMMUNITY): Payer: Self-pay | Admitting: *Deleted

## 2015-03-05 ENCOUNTER — Emergency Department (INDEPENDENT_AMBULATORY_CARE_PROVIDER_SITE_OTHER)
Admission: EM | Admit: 2015-03-05 | Discharge: 2015-03-05 | Disposition: A | Discharge status: Home / Self Care | Payer: Medicare Other | Source: Home / Self Care | Attending: Family Medicine | Admitting: Family Medicine

## 2015-03-05 DIAGNOSIS — H109 Unspecified conjunctivitis: Secondary | ICD-10-CM | POA: Diagnosis not present

## 2015-03-05 MED ORDER — TOBRAMYCIN 0.3 % OP SOLN: 1.0000 [drp] | Freq: Four times a day (QID) | OPHTHALMIC | Status: AC

## 2015-03-05 NOTE — ED Provider Notes (Signed)
CSN: [id number]     Arrival date & time 03/05/15  1301 History   First MD Initiated Contact with Patient 03/05/15 1316     Chief Complaint  Patient presents with  . Eye Problem   (Consider location/radiation/quality/duration/timing/severity/associated sxs/prior Treatment) Patient is a 64 y.o. female presenting with eye problem. The history is provided by the patient.  Eye Problem Location:  Both Quality:  Burning Severity:  Mild Onset quality:  Gradual Progression:  Unchanged Chronicity:  New Context comment:  Red this am with bilat mucous drainage this am., gritty feeling. Associated symptoms: crusting, discharge and redness   Associated symptoms: no blurred vision, no decreased vision, no double vision and no photophobia     Past Medical History  Diagnosis Date  . Arthritis   . Hypertension    History reviewed. No pertinent past surgical history. History reviewed. No pertinent family history. Social History  Substance Use Topics  . Smoking status: Current Every Day Smoker -- 0.25 packs/day  . Smokeless tobacco: None  . Alcohol Use: 1.2 oz/week    2 Cans of beer per week   OB History    No data available     Review of Systems  Constitutional: Negative.   HENT: Negative.   Eyes: Positive for discharge and redness. Negative for blurred vision, double vision and photophobia.  Respiratory: Negative.   All other systems reviewed and are negative.   Allergies  Review of patient's allergies indicates no known allergies.  Home Medications   Prior to Admission medications   Medication Sig Start Date End Date Taking? Authorizing Provider  amoxicillin (AMOXIL) 500 MG capsule Take 2 capsules (1,000 mg total) by mouth 2 (two) times daily. Patient not taking: Reported on 11/13/2014 12/27/12   Janne Napoleon, NP  calcium-vitamin D (OSCAL WITH D) 500-200 MG-UNIT per tablet Take 1 tablet by mouth.    Historical Provider, MD  celecoxib (CELEBREX) 100 MG capsule Take 1 capsule (100  mg total) by mouth 2 (two) times daily. 11/13/14   Tresa Garter, MD  cyclobenzaprine (FLEXERIL) 10 MG tablet Take 1 tablet (10 mg total) by mouth 3 (three) times daily as needed for muscle spasms. 11/13/14   Tresa Garter, MD  HYDROcodone-acetaminophen (NORCO) 5-325 MG per tablet Take 1 tablet by mouth every 6 (six) hours as needed for moderate pain. Patient not taking: Reported on 11/13/2014 12/27/12   Janne Napoleon, NP  ibuprofen (ADVIL,MOTRIN) 200 MG tablet Take 400 mg by mouth every 6 (six) hours as needed for pain.    Historical Provider, MD  lisinopril-hydrochlorothiazide (ZESTORETIC) 20-12.5 MG tablet Take 1 tablet by mouth daily. 01/13/15   Tresa Garter, MD  simvastatin (ZOCOR) 10 MG tablet Take 1 tablet (10 mg total) by mouth at bedtime. 11/13/14   Tresa Garter, MD  tobramycin (TOBREX) 0.3 % ophthalmic solution Place 1 drop into both eyes every 6 (six) hours. 03/05/15   Billy Fischer, MD  traMADol (ULTRAM) 50 MG tablet Take 1 tablet (50 mg total) by mouth every 6 (six) hours as needed. 11/13/14   Tresa Garter, MD   Meds Ordered and Administered this Visit  Medications - No data to display  BP 134/86 mmHg  Pulse 75  Temp(Src) 98.9 F (37.2 C) (Oral)  Resp 16  SpO2 97% No data found.   Physical Exam  Constitutional: She is oriented to person, place, and time. She appears well-developed and well-nourished. No distress.  Eyes: EOM are normal. Pupils are equal, round, and  reactive to light. Right eye exhibits discharge. Left eye exhibits discharge. Right conjunctiva is injected. Right conjunctiva has no hemorrhage. Left conjunctiva is injected. Left conjunctiva has no hemorrhage.  Neck: Normal range of motion. Neck supple.  Lymphadenopathy:    She has no cervical adenopathy.  Neurological: She is alert and oriented to person, place, and time.  Skin: Skin is warm and dry.  Nursing note and vitals reviewed.   ED Course  Procedures (including critical care  time)  Labs Review Labs Reviewed - No data to display  Imaging Review No results found.   Visual Acuity Review  Right Eye Distance:   Left Eye Distance:   Bilateral Distance:    Right Eye Near:   Left Eye Near:    Bilateral Near:         MDM   1. Bilateral conjunctivitis        Billy Fischer, MD 03/05/15 1348

## 2015-03-05 NOTE — Discharge Instructions (Signed)
Warm compress to eyes before using drop medicine for 7 days.

## 2015-03-05 NOTE — ED Notes (Signed)
Pt  Irritation r  Eye     X  8  Days      Getting  Worse   Now  The     l  Eye        Is       Starting    To  Give  Her  Problems

## 2018-06-12 ENCOUNTER — Telehealth: Payer: Self-pay

## 2018-06-12 NOTE — Telephone Encounter (Signed)
No answer no VM 06/12/18

## 2019-08-02 ENCOUNTER — Ambulatory Visit: Payer: Medicare Other | Attending: Internal Medicine

## 2019-08-02 DIAGNOSIS — Z23 Encounter for immunization: Secondary | ICD-10-CM

## 2019-08-02 NOTE — Progress Notes (Signed)
   Covid-19 Vaccination Clinic  Name:  Carol Merritt    MRN: [id number] DOB: [birth year]  08/02/2019  Carol Merritt was observed post Covid-19 immunization for 15 minutes without incident. She was provided with Vaccine Information Sheet and instruction to access the V-Safe system.   Carol Merritt was instructed to call 911 with any severe reactions post vaccine: Marland Kitchen Difficulty breathing  . Swelling of face and throat  . A fast heartbeat  . A bad rash all over body  . Dizziness and weakness   Immunizations Administered    Name Date Dose VIS Date Route   Moderna COVID-19 Vaccine 08/02/2019 11:15 AM 0.5 mL 01/2019 Intramuscular   Manufacturer: Levan Hurst   Lot: [id number]   [place]: 29290-903-01

## 2019-08-30 ENCOUNTER — Ambulatory Visit: Payer: Medicare Other | Attending: Internal Medicine

## 2019-08-30 DIAGNOSIS — Z23 Encounter for immunization: Secondary | ICD-10-CM

## 2019-08-30 NOTE — Progress Notes (Signed)
   Covid-19 Vaccination Clinic  Name:  Carol Merritt    MRN: [id number] DOB: 1950/03/01  08/30/2019  [name] was observed post Covid-19 immunization for 15 minutes without incident. She was provided with Vaccine Information Sheet and instruction to access the V-Safe system.   Ms. Carol Merritt was instructed to call 911 with any severe reactions post vaccine: Marland Kitchen Difficulty breathing  . Swelling of face and throat  . A fast heartbeat  . A bad rash all over body  . Dizziness and weakness   Immunizations Administered    Name Date Dose VIS Date Route   Moderna COVID-19 Vaccine 08/30/2019 10:58 AM 0.5 mL 01/2019 Intramuscular   Manufacturer: Moderna   Lot: [id number]   [place]: [id number]

## 2019-09-01 ENCOUNTER — Ambulatory Visit (INDEPENDENT_AMBULATORY_CARE_PROVIDER_SITE_OTHER): Payer: Medicare Other

## 2019-09-01 ENCOUNTER — Encounter (HOSPITAL_COMMUNITY): Payer: Self-pay

## 2019-09-01 ENCOUNTER — Other Ambulatory Visit: Payer: Self-pay

## 2019-09-01 ENCOUNTER — Ambulatory Visit (HOSPITAL_COMMUNITY)
Admission: EM | Admit: 2019-09-01 | Discharge: 2019-09-01 | Disposition: A | Discharge status: Home / Self Care | Payer: Medicare Other | Attending: Emergency Medicine | Admitting: Emergency Medicine

## 2019-09-01 DIAGNOSIS — M25532 Pain in left wrist: Secondary | ICD-10-CM

## 2019-09-01 DIAGNOSIS — W19XXXA Unspecified fall, initial encounter: Secondary | ICD-10-CM

## 2019-09-01 DIAGNOSIS — S52615A Nondisplaced fracture of left ulna styloid process, initial encounter for closed fracture: Secondary | ICD-10-CM

## 2019-09-01 DIAGNOSIS — S52592A Other fractures of lower end of left radius, initial encounter for closed fracture: Secondary | ICD-10-CM | POA: Diagnosis not present

## 2019-09-01 DIAGNOSIS — S52612A Displaced fracture of left ulna styloid process, initial encounter for closed fracture: Secondary | ICD-10-CM | POA: Diagnosis not present

## 2019-09-01 DIAGNOSIS — S52502A Unspecified fracture of the lower end of left radius, initial encounter for closed fracture: Secondary | ICD-10-CM

## 2019-09-01 MED ORDER — IBUPROFEN 800 MG PO TABS
ORAL_TABLET | ORAL | Status: AC
  Filled 2019-09-01: qty 1

## 2019-09-01 MED ORDER — HYDROCODONE-ACETAMINOPHEN 5-325 MG PO TABS: 1.0000 | ORAL_TABLET | Freq: Four times a day (QID) | ORAL | 0 refills | Status: AC | PRN

## 2019-09-01 MED ORDER — IBUPROFEN 800 MG PO TABS
800.0000 mg | ORAL_TABLET | Freq: Three times a day (TID) | ORAL | 0 refills | Status: DC
Start: 2019-09-01 — End: 2020-03-29

## 2019-09-01 MED ORDER — IBUPROFEN 800 MG PO TABS
800.0000 mg | ORAL_TABLET | Freq: Once | ORAL | Status: AC
  Administered 2019-09-01: 800 mg via ORAL

## 2019-09-01 NOTE — Discharge Instructions (Signed)
Please follow-up with orthopedics/Dr. Stann Mainland for further treatment and follow-up of wrist fracture-contact below Use anti-inflammatories for pain/swelling. You may take up to 800 mg Ibuprofen every 8 hours with food. You may supplement Ibuprofen with Tylenol [phone] mg every 8 hours.  Hydrocodone for severe pain-use sparingly, will cause drowsiness, do not drive or work after taking

## 2019-09-01 NOTE — ED Triage Notes (Signed)
Pt C/o fall and hurt left wrist, she fall yesterday while at home.

## 2019-09-01 NOTE — ED Notes (Signed)
Removed 2 rings from left ring finger of injured extremity. Placed both in specimen cup and gave to patient.  Ortho tech, kyra, at bedside during process of removing rings and handing to patient.    Reevaluated pulse , radial, left pulse 2 +, cap refill brisk.

## 2019-09-01 NOTE — Progress Notes (Signed)
Orthopedic Tech Progress Note Patient Details:  [name] Feb 16, 1950 [id number]  Ortho Devices Type of Ortho Device: Sugartong splint, Arm sling Ortho Device/Splint Location: ULE Ortho Device/Splint Interventions: Application, Ordered   Post Interventions Patient Tolerated: Well Instructions Provided: Care of device, Adjustment of device   [name] [name] 09/01/2019, 12:48 PM

## 2019-09-01 NOTE — ED Notes (Signed)
ice pack provided

## 2019-09-01 NOTE — ED Notes (Signed)
Patient returned from xray.

## 2019-09-02 ENCOUNTER — Encounter (HOSPITAL_COMMUNITY): Payer: Self-pay

## 2019-09-02 ENCOUNTER — Emergency Department (HOSPITAL_COMMUNITY)
Admission: EM | Admit: 2019-09-02 | Discharge: 2019-09-02 | Disposition: A | Discharge status: Home / Self Care | Payer: Medicare HMO | Attending: Emergency Medicine | Admitting: Emergency Medicine

## 2019-09-02 ENCOUNTER — Emergency Department (HOSPITAL_COMMUNITY): Payer: Medicare HMO

## 2019-09-02 ENCOUNTER — Other Ambulatory Visit: Payer: Self-pay

## 2019-09-02 DIAGNOSIS — S50912A Unspecified superficial injury of left forearm, initial encounter: Secondary | ICD-10-CM | POA: Diagnosis present

## 2019-09-02 DIAGNOSIS — S52532A Colles' fracture of left radius, initial encounter for closed fracture: Secondary | ICD-10-CM | POA: Insufficient documentation

## 2019-09-02 DIAGNOSIS — F17200 Nicotine dependence, unspecified, uncomplicated: Secondary | ICD-10-CM | POA: Insufficient documentation

## 2019-09-02 DIAGNOSIS — S52612A Displaced fracture of left ulna styloid process, initial encounter for closed fracture: Secondary | ICD-10-CM | POA: Diagnosis not present

## 2019-09-02 DIAGNOSIS — Z79899 Other long term (current) drug therapy: Secondary | ICD-10-CM | POA: Insufficient documentation

## 2019-09-02 DIAGNOSIS — Y939 Activity, unspecified: Secondary | ICD-10-CM | POA: Diagnosis not present

## 2019-09-02 DIAGNOSIS — Y929 Unspecified place or not applicable: Secondary | ICD-10-CM | POA: Insufficient documentation

## 2019-09-02 DIAGNOSIS — Y999 Unspecified external cause status: Secondary | ICD-10-CM | POA: Insufficient documentation

## 2019-09-02 DIAGNOSIS — W010XXA Fall on same level from slipping, tripping and stumbling without subsequent striking against object, initial encounter: Secondary | ICD-10-CM | POA: Diagnosis not present

## 2019-09-02 DIAGNOSIS — I10 Essential (primary) hypertension: Secondary | ICD-10-CM | POA: Insufficient documentation

## 2019-09-02 DIAGNOSIS — S52592A Other fractures of lower end of left radius, initial encounter for closed fracture: Secondary | ICD-10-CM | POA: Diagnosis not present

## 2019-09-02 MED ORDER — LIDOCAINE-EPINEPHRINE 1 %-1:100000 IJ SOLN
10.0000 mL | Freq: Once | INTRAMUSCULAR | Status: AC
  Administered 2019-09-02: 10 mL via INTRADERMAL
  Filled 2019-09-02: qty 1

## 2019-09-02 MED ORDER — ACETAMINOPHEN 500 MG PO TABS
1000.0000 mg | ORAL_TABLET | Freq: Once | ORAL | Status: AC
  Administered 2019-09-02: 1000 mg via ORAL
  Filled 2019-09-02: qty 2

## 2019-09-02 MED ORDER — OXYCODONE HCL 5 MG PO TABS
5.0000 mg | ORAL_TABLET | Freq: Once | ORAL | Status: AC
  Administered 2019-09-02: 5 mg via ORAL
  Filled 2019-09-02: qty 1

## 2019-09-02 MED ORDER — MORPHINE SULFATE 15 MG PO TABS: 7.5000 mg | ORAL_TABLET | ORAL | 0 refills | Status: AC | PRN

## 2019-09-02 NOTE — ED Triage Notes (Signed)
Pt presents to ED from home with complaints of left arm swelling from fall yesterday

## 2019-09-02 NOTE — Progress Notes (Signed)
Orthopedic Tech Progress Note Patient Details:  Carol Merritt 1951-01-05 [id number]  Ortho Devices Type of Ortho Device: Sugartong splint, Arm sling Ortho Device/Splint Location: ule Ortho Device/Splint Interventions: Application, Ordered   Post Interventions Patient Tolerated: Well Instructions Provided: Care of device   Francys Bolin A Dandra Velardi 09/02/2019, 8:04 PM

## 2019-09-02 NOTE — ED Provider Notes (Signed)
[facility]    CSN: [id number] Arrival date & time: 09/01/19  1031      History   Chief Complaint Chief Complaint  Patient presents with  . Fall  . Wrist Injury    HPI Carol Merritt is a 69 y.o. female presenting today for evaluation of left wrist injury.  Patient fell while she was at home yesterday caught herself with her left wrist.  Since this felt her wrist has been deformed and has a lot of pain and swelling.  She denies prior injury to this wrist.  Denies numbness or tingling.  HPI  Past Medical History:  Diagnosis Date  . Arthritis   . Hypertension     Patient Active Problem List   Diagnosis Date Noted  . Pain in hip joint 11/13/2014  . Dermoid cyst of face 11/13/2014  . Primary osteoarthritis of right knee 09/09/2013  . Bilateral low back pain without sciatica 09/09/2013  . Accelerated hypertension 10/09/2012  . Osteoarthritis of right knee 10/09/2012  . Nicotine dependence 10/09/2012    History reviewed. No pertinent surgical history.  OB History   No obstetric history on file.      Home Medications    Prior to Admission medications   Medication Sig Start Date End Date Taking? Authorizing Provider  calcium-vitamin D (OSCAL WITH D) 500-200 MG-UNIT per tablet Take 1 tablet by mouth.    [provider]  celecoxib (CELEBREX) 100 MG capsule Take 1 capsule (100 mg total) by mouth 2 (two) times daily. 11/13/14   Tresa Garter, MD  cyclobenzaprine (FLEXERIL) 10 MG tablet Take 1 tablet (10 mg total) by mouth 3 (three) times daily as needed for muscle spasms. 11/13/14   Tresa Garter, MD  HYDROcodone-acetaminophen (NORCO/VICODIN) 5-325 MG tablet Take 1-2 tablets by mouth every 6 (six) hours as needed for severe pain. 09/01/19   Simrah Chatham C, PA-C  ibuprofen (ADVIL) 800 MG tablet Take 1 tablet (800 mg total) by mouth 3 (three) times daily. 09/01/19   Cecila Satcher C, PA-C  lisinopril-hydrochlorothiazide (ZESTORETIC)  20-12.5 MG tablet Take 1 tablet by mouth daily. 01/13/15   Tresa Garter, MD  simvastatin (ZOCOR) 10 MG tablet Take 1 tablet (10 mg total) by mouth at bedtime. 11/13/14   Tresa Garter, MD  tobramycin (TOBREX) 0.3 % ophthalmic solution Place 1 drop into both eyes every 6 (six) hours. 03/05/15   Billy Fischer, MD  traMADol (ULTRAM) 50 MG tablet Take 1 tablet (50 mg total) by mouth every 6 (six) hours as needed. 11/13/14   Tresa Garter, MD    Family History History reviewed. No pertinent family history.  Social History Social History   Tobacco Use  . Smoking status: Current Every Day Smoker    Packs/day: 0.25  Substance Use Topics  . Alcohol use: Yes    Alcohol/week: 2.0 standard drinks    Types: 2 Cans of beer per week  . Drug use: Not on file     Allergies   Patient has no known allergies.   Review of Systems Review of Systems  Constitutional: Negative for fatigue and fever.  Eyes: Negative for visual disturbance.  Respiratory: Negative for shortness of breath.   Cardiovascular: Negative for chest pain.  Gastrointestinal: Negative for abdominal pain, nausea and vomiting.  Musculoskeletal: Positive for arthralgias and joint swelling.  Skin: Negative for color change, rash and wound.  Neurological: Negative for dizziness, weakness, light-headedness and headaches.     Physical  Exam Triage Vital Signs ED Triage Vitals  Enc Vitals Group     BP 09/01/19 1120 (!) 175/81     Pulse Rate 09/01/19 1120 71     Resp 09/01/19 1120 16     Temp 09/01/19 1120 98.5 F (36.9 C)     Temp Source 09/01/19 1120 Oral     SpO2 09/01/19 1120 99 %     Weight --      Height --      Head Circumference --      Peak Flow --      Pain Score 09/01/19 1121 10     Pain Loc --      Pain Edu? --      Excl. in [place]? --    No data found.  Updated Vital Signs BP (!) 175/81 (BP Location: Right Arm)   Pulse 71   Temp 98.5 F (36.9 C) (Oral)   Resp 16   SpO2 99%   Visual  Acuity Right Eye Distance:   Left Eye Distance:   Bilateral Distance:    Right Eye Near:   Left Eye Near:    Bilateral Near:     Physical Exam Vitals and nursing note reviewed.  Constitutional:      Appearance: She is well-developed.     Comments: No acute distress  HENT:     Head: Normocephalic and atraumatic.     Nose: Nose normal.  Eyes:     Conjunctiva/sclera: Conjunctivae normal.  Cardiovascular:     Rate and Rhythm: Normal rate.  Pulmonary:     Effort: Pulmonary effort is normal. No respiratory distress.  Abdominal:     General: There is no distension.  Musculoskeletal:        General: Normal range of motion.     Cervical back: Neck supple.     Comments: Left wrist: Significant swelling, moderate deformity noted about wrist, tender to palpation of distal radius and ulna, nontender throughout dorsum of hand Radial pulse 2+ Nontender at elbow  Skin:    General: Skin is warm and dry.  Neurological:     Mental Status: She is alert and oriented to person, place, and time.      UC Treatments / Results  Labs (all labs ordered are listed, but only abnormal results are displayed) Labs Reviewed - No data to display  EKG   Radiology DG Wrist Complete Left  Result Date: 09/02/2019 CLINICAL DATA:  Left wrist injury today when the patient fell on stairs. Initial encounter. EXAM: LEFT WRIST - COMPLETE 3+ VIEW COMPARISON:  None. FINDINGS: The patient has an acute fracture of the distal metaphysis of the radius with mild dorsal angulation and displacement. The fracture is impacted approximately 1.2 cm. Minimally displaced fracture of the ulnar styloid is noted. There is soft tissue swelling about the wrist. IMPRESSION: Acute fractures of the distal radius and ulnar styloid. Electronically Signed   By: Inge Rise M.D.   On: 09/02/2019 14:42   DG Wrist Complete Left  Result Date: 09/01/2019 CLINICAL DATA:  69 year old female with history of trauma from a fall yesterday  complaining of left wrist pain. EXAM: LEFT WRIST - COMPLETE 3+ VIEW COMPARISON:  No priors. FINDINGS: Four views of the left wrist demonstrate a comminuted impacted fracture of the distal radial metaphysis which demonstrates minimal dorsal angulation (approximately 10-15 degrees). No definite intra-articular extension is confidently identified. There is also a minimally displaced fracture of the ulnar styloid. Extensive soft tissue swelling surrounding the wrist  joint. IMPRESSION: 1. Acute mildly comminuted and impacted dorsally angulated fracture of the distal radial metaphyseal region, as above. 2. Minimally displaced ulnar styloid avulsion fracture. Electronically Signed   By: Vinnie Langton M.D.   On: 09/01/2019 12:15    Procedures Procedures (including critical care time)  Medications Ordered in UC Medications  ibuprofen (ADVIL) tablet 800 mg (800 mg Oral Given 09/01/19 1214)    Initial Impression / Assessment and Plan / UC Course  I have reviewed the triage vital signs and the nursing notes.  Pertinent labs & imaging results that were available during my care of the patient were reviewed by me and considered in my medical decision making (see chart for details).     Distal radius fracture, mildly comminuted and angulated, ulnar styloid fracture.  Placing in sugar tong and will have follow-up with hand/Ortho for further management.  Discussed with patient likely may need surgery.  Tylenol and ibuprofen for mild to moderate pain, hydrocodone for severe pain.  Contact for Ortho follow-up provided, call first thing Monday morning.  Discussed strict return precautions. Patient verbalized understanding and is agreeable with plan.  Final Clinical Impressions(s) / UC Diagnoses   Final diagnoses:  Closed fracture of distal end of left radius, unspecified fracture morphology, initial encounter  Closed nondisplaced fracture of styloid process of left ulna, initial encounter  Fall, initial  encounter     Discharge Instructions     Please follow-up with orthopedics/Dr. Stann Mainland for further treatment and follow-up of wrist fracture-contact below Use anti-inflammatories for pain/swelling. You may take up to 800 mg Ibuprofen every 8 hours with food. You may supplement Ibuprofen with Tylenol [phone] mg every 8 hours.  Hydrocodone for severe pain-use sparingly, will cause drowsiness, do not drive or work after taking    ED Prescriptions    Medication Sig Dispense Auth. Provider   ibuprofen (ADVIL) 800 MG tablet Take 1 tablet (800 mg total) by mouth 3 (three) times daily. 21 tablet Neela Zecca C, PA-C   HYDROcodone-acetaminophen (NORCO/VICODIN) 5-325 MG tablet Take 1-2 tablets by mouth every 6 (six) hours as needed for severe pain. 12 tablet Kanen Mottola, [place] C, PA-C     I have reviewed the PDMP during this encounter.   Janith Lima, PA-C 09/02/19 1455

## 2019-09-02 NOTE — ED Provider Notes (Signed)
[facility] EMERGENCY DEPARTMENT Provider Note   CSN: [id number] Arrival date & time: 09/02/19  1322     History Chief Complaint  Patient presents with  . Arm Pain    Carol Merritt is a 69 y.o. female.  69 yo F with a chief complaints of fall on outstretched hand.  This happened about 5 days ago.  She denies other injury.  Has been having pain and swelling to the area.  Worse on the ulnar aspect.  No pain at the elbow.  Having some mild weakness to her thumb.  The history is provided by the patient.  Arm Pain This is a new problem. The current episode started more than 1 week ago. The problem occurs constantly. The problem has not changed since onset.Pertinent negatives include no chest pain, no abdominal pain, no headaches and no shortness of breath. Nothing aggravates the symptoms. Nothing relieves the symptoms. She has tried nothing for the symptoms. The treatment provided no relief.       Past Medical History:  Diagnosis Date  . Arthritis   . Hypertension     Patient Active Problem List   Diagnosis Date Noted  . Pain in hip joint 11/13/2014  . Dermoid cyst of face 11/13/2014  . Primary osteoarthritis of right knee 09/09/2013  . Bilateral low back pain without sciatica 09/09/2013  . Accelerated hypertension 10/09/2012  . Osteoarthritis of right knee 10/09/2012  . Nicotine dependence 10/09/2012    History reviewed. No pertinent surgical history.   OB History   No obstetric history on file.     No family history on file.  Social History   Tobacco Use  . Smoking status: Current Every Day Smoker    Packs/day: 0.25  Substance Use Topics  . Alcohol use: Yes    Alcohol/week: 2.0 standard drinks    Types: 2 Cans of beer per week  . Drug use: Not on file    Home Medications Prior to Admission medications   Medication Sig Start Date End Date Taking? Authorizing Provider  calcium-vitamin D (OSCAL WITH D) 500-200 MG-UNIT per tablet  Take 1 tablet by mouth.    [provider]  celecoxib (CELEBREX) 100 MG capsule Take 1 capsule (100 mg total) by mouth 2 (two) times daily. 11/13/14   Tresa Garter, MD  cyclobenzaprine (FLEXERIL) 10 MG tablet Take 1 tablet (10 mg total) by mouth 3 (three) times daily as needed for muscle spasms. 11/13/14   Tresa Garter, MD  HYDROcodone-acetaminophen (NORCO/VICODIN) 5-325 MG tablet Take 1-2 tablets by mouth every 6 (six) hours as needed for severe pain. 09/01/19   Wieters, Hallie C, PA-C  ibuprofen (ADVIL) 800 MG tablet Take 1 tablet (800 mg total) by mouth 3 (three) times daily. 09/01/19   Wieters, Hallie C, PA-C  lisinopril-hydrochlorothiazide (ZESTORETIC) 20-12.5 MG tablet Take 1 tablet by mouth daily. 01/13/15   Tresa Garter, MD  morphine (MSIR) 15 MG tablet Take 0.5 tablets (7.5 mg total) by mouth every 4 (four) hours as needed for severe pain. 09/02/19   Deno Etienne, DO  simvastatin (ZOCOR) 10 MG tablet Take 1 tablet (10 mg total) by mouth at bedtime. 11/13/14   Tresa Garter, MD  tobramycin (TOBREX) 0.3 % ophthalmic solution Place 1 drop into both eyes every 6 (six) hours. 03/05/15   Billy Fischer, MD  traMADol (ULTRAM) 50 MG tablet Take 1 tablet (50 mg total) by mouth every 6 (six) hours as needed. 11/13/14  Tresa Garter, MD    Allergies    Patient has no known allergies.  Review of Systems   Review of Systems  Constitutional: Negative for chills and fever.  HENT: Negative for congestion and rhinorrhea.   Eyes: Negative for redness and visual disturbance.  Respiratory: Negative for shortness of breath and wheezing.   Cardiovascular: Negative for chest pain and palpitations.  Gastrointestinal: Negative for abdominal pain, nausea and vomiting.  Genitourinary: Negative for dysuria and urgency.  Musculoskeletal: Positive for arthralgias and myalgias.  Skin: Negative for pallor and wound.  Neurological: Negative for dizziness and headaches.     Physical Exam Updated Vital Signs BP (!) 210/100 (BP Location: Right Arm)   Pulse 68   Temp 98.8 F (37.1 C) (Oral)   Resp 16   SpO2 99%   Physical Exam Vitals and nursing note reviewed.  Constitutional:      General: She is not in acute distress.    Appearance: She is well-developed. She is not diaphoretic.  HENT:     Head: Normocephalic and atraumatic.  Eyes:     Pupils: Pupils are equal, round, and reactive to light.  Cardiovascular:     Rate and Rhythm: Normal rate and regular rhythm.     Heart sounds: No murmur heard.  No friction rub. No gallop.   Pulmonary:     Effort: Pulmonary effort is normal.     Breath sounds: No wheezing or rales.  Abdominal:     General: There is no distension.     Palpations: Abdomen is soft.     Tenderness: There is no abdominal tenderness.  Musculoskeletal:        General: Swelling and tenderness present.     Cervical back: Normal range of motion and neck supple.     Comments: Tenderness and edema to the left wrist with dorsal displacement.  Intact pulse motor and sensation.  Her extension of her left wrist is somewhat diminished compared to the right.  Otherwise motor seems to be intact.  Skin:    General: Skin is warm and dry.  Neurological:     Mental Status: She is alert and oriented to person, place, and time.  Psychiatric:        Behavior: Behavior normal.     ED Results / Procedures / Treatments   Labs (all labs ordered are listed, but only abnormal results are displayed) Labs Reviewed - No data to display  EKG None  Radiology DG Wrist Complete Left  Result Date: 09/02/2019 CLINICAL DATA:  Left wrist injury today when the patient fell on stairs. Initial encounter. EXAM: LEFT WRIST - COMPLETE 3+ VIEW COMPARISON:  None. FINDINGS: The patient has an acute fracture of the distal metaphysis of the radius with mild dorsal angulation and displacement. The fracture is impacted approximately 1.2 cm. Minimally displaced  fracture of the ulnar styloid is noted. There is soft tissue swelling about the wrist. IMPRESSION: Acute fractures of the distal radius and ulnar styloid. Electronically Signed   By: Inge Rise M.D.   On: 09/02/2019 14:42   DG Wrist Complete Left  Result Date: 09/01/2019 CLINICAL DATA:  69 year old female with history of trauma from a fall yesterday complaining of left wrist pain. EXAM: LEFT WRIST - COMPLETE 3+ VIEW COMPARISON:  No priors. FINDINGS: Four views of the left wrist demonstrate a comminuted impacted fracture of the distal radial metaphysis which demonstrates minimal dorsal angulation (approximately 10-15 degrees). No definite intra-articular extension is confidently identified. There is also  a minimally displaced fracture of the ulnar styloid. Extensive soft tissue swelling surrounding the wrist joint. IMPRESSION: 1. Acute mildly comminuted and impacted dorsally angulated fracture of the distal radial metaphyseal region, as above. 2. Minimally displaced ulnar styloid avulsion fracture. Electronically Signed   By: Vinnie Langton M.D.   On: 09/01/2019 12:15    Procedures Reduction of fracture  Date/Time: 09/02/2019 7:19 PM Performed by: Deno Etienne, DO Authorized by: Deno Etienne, DO  Preparation: Patient was prepped and draped in the usual sterile fashion. Local anesthesia used: yes Anesthesia: hematoma block  Anesthesia: Local anesthesia used: yes Local Anesthetic: lidocaine 2% with epinephrine Anesthetic total: 10 mL  Sedation: Patient sedated: no  Patient tolerance: patient tolerated the procedure well with no immediate complications Comments: Mild improvement of alignment post finger traps    (including critical care time)  Medications Ordered in ED Medications  lidocaine-EPINEPHrine (XYLOCAINE W/EPI) 1 %-1:100000 (with pres) injection 10 mL (has no administration in time range)  acetaminophen (TYLENOL) tablet 1,000 mg (1,000 mg Oral Given 09/02/19 1717)   oxyCODONE (Oxy IR/ROXICODONE) immediate release tablet 5 mg (5 mg Oral Given 09/02/19 1717)    ED Course  I have reviewed the triage vital signs and the nursing notes.  Pertinent labs & imaging results that were available during my care of the patient were reviewed by me and considered in my medical decision making (see chart for details).    MDM Rules/Calculators/A&P                          69 yo F with a chief complaints of left wrist pain.  This was after a fall onto an outstretched arm.  She has a distal radius and ulna's fracture seen on x-ray viewed by me.  I discussed case with Dr. Claudia Desanctis.  He recommended attempting hematoma block and reduction in place in a sugar tong and he will follow her up in the office.  7:19 PM:  I have discussed the diagnosis/risks/treatment options with the patient and believe the pt to be eligible for discharge home to follow-up with Hand. We also discussed returning to the ED immediately if new or worsening sx occur. We discussed the sx which are most concerning (e.g., sudden worsening pain, numbness, tingling) that necessitate immediate return. Medications administered to the patient during their visit and any new prescriptions provided to the patient are listed below.  Medications given during this visit Medications  lidocaine-EPINEPHrine (XYLOCAINE W/EPI) 1 %-1:100000 (with pres) injection 10 mL (has no administration in time range)  acetaminophen (TYLENOL) tablet 1,000 mg (1,000 mg Oral Given 09/02/19 1717)  oxyCODONE (Oxy IR/ROXICODONE) immediate release tablet 5 mg (5 mg Oral Given 09/02/19 1717)     The patient appears reasonably screen and/or stabilized for discharge and I doubt any other medical condition or other [facility] requiring further screening, evaluation, or treatment in the ED at this time prior to discharge.   Final Clinical Impression(s) / ED Diagnoses Final diagnoses:  Closed Colles' fracture of left radius, initial encounter    Rx / DC  Orders ED Discharge Orders         Ordered    morphine (MSIR) 15 MG tablet  Every 4 hours PRN     Discontinue  Reprint     09/02/19 Nelson, Meily Glowacki, DO 09/02/19 1919

## 2019-09-02 NOTE — Discharge Instructions (Signed)
Take tylenol 1000mg (2 extra strength) four times a day.   Then take the pain medicine if you feel like you need it. Narcotics do not help with the pain, they only make you care about it less.  You can become addicted to this, people may break into your house to steal it.  It will constipate you.  If you drive under the influence of this medicine you can get a DUI.    Try to elevate your arm above the level of your heart as often as you can.  Return for worsening pain or numbness or tingling to the hand as your splint may be too tight and may need to be removed.

## 2019-09-11 ENCOUNTER — Emergency Department (HOSPITAL_COMMUNITY)
Admission: EM | Admit: 2019-09-11 | Discharge: 2019-09-11 | Disposition: A | Discharge status: Home / Self Care | Payer: Medicare HMO | Attending: Emergency Medicine | Admitting: Emergency Medicine

## 2019-09-11 ENCOUNTER — Encounter (HOSPITAL_COMMUNITY): Payer: Self-pay | Admitting: Emergency Medicine

## 2019-09-11 DIAGNOSIS — R2232 Localized swelling, mass and lump, left upper limb: Secondary | ICD-10-CM | POA: Insufficient documentation

## 2019-09-11 DIAGNOSIS — Z5321 Procedure and treatment not carried out due to patient leaving prior to being seen by health care provider: Secondary | ICD-10-CM | POA: Insufficient documentation

## 2019-09-11 DIAGNOSIS — M25532 Pain in left wrist: Secondary | ICD-10-CM | POA: Insufficient documentation
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
## 2019-09-11 NOTE — ED Notes (Signed)
Called pt x2 for vitals, no response. °
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
## 2020-03-28 ENCOUNTER — Emergency Department (HOSPITAL_COMMUNITY)
Admission: EM | Admit: 2020-03-28 | Discharge: 2020-03-31 | Disposition: A | Discharge status: Home / Self Care | Payer: Medicare HMO | Attending: Emergency Medicine | Admitting: Emergency Medicine

## 2020-03-28 ENCOUNTER — Emergency Department (HOSPITAL_COMMUNITY): Payer: Medicare HMO

## 2020-03-28 ENCOUNTER — Other Ambulatory Visit: Payer: Self-pay

## 2020-03-28 ENCOUNTER — Encounter (HOSPITAL_COMMUNITY): Payer: Self-pay | Admitting: Emergency Medicine

## 2020-03-28 DIAGNOSIS — I6529 Occlusion and stenosis of unspecified carotid artery: Secondary | ICD-10-CM | POA: Diagnosis not present

## 2020-03-28 DIAGNOSIS — F0281 Dementia in other diseases classified elsewhere with behavioral disturbance: Secondary | ICD-10-CM | POA: Insufficient documentation

## 2020-03-28 DIAGNOSIS — I10 Essential (primary) hypertension: Secondary | ICD-10-CM | POA: Diagnosis not present

## 2020-03-28 DIAGNOSIS — F17210 Nicotine dependence, cigarettes, uncomplicated: Secondary | ICD-10-CM | POA: Diagnosis not present

## 2020-03-28 DIAGNOSIS — F0391 Unspecified dementia with behavioral disturbance: Secondary | ICD-10-CM | POA: Diagnosis present

## 2020-03-28 DIAGNOSIS — F329 Major depressive disorder, single episode, unspecified: Secondary | ICD-10-CM | POA: Diagnosis not present

## 2020-03-28 DIAGNOSIS — Z79899 Other long term (current) drug therapy: Secondary | ICD-10-CM | POA: Diagnosis not present

## 2020-03-28 DIAGNOSIS — I739 Peripheral vascular disease, unspecified: Secondary | ICD-10-CM | POA: Diagnosis not present

## 2020-03-28 DIAGNOSIS — R4182 Altered mental status, unspecified: Secondary | ICD-10-CM | POA: Diagnosis not present

## 2020-03-28 DIAGNOSIS — R9082 White matter disease, unspecified: Secondary | ICD-10-CM | POA: Diagnosis not present

## 2020-03-28 DIAGNOSIS — Z789 Other specified health status: Secondary | ICD-10-CM | POA: Diagnosis present

## 2020-03-28 DIAGNOSIS — Z20822 Contact with and (suspected) exposure to covid-19: Secondary | ICD-10-CM | POA: Insufficient documentation

## 2020-03-28 DIAGNOSIS — R410 Disorientation, unspecified: Secondary | ICD-10-CM | POA: Diagnosis not present

## 2020-03-28 DIAGNOSIS — R4689 Other symptoms and signs involving appearance and behavior: Secondary | ICD-10-CM

## 2020-03-28 DIAGNOSIS — F29 Unspecified psychosis not due to a substance or known physiological condition: Secondary | ICD-10-CM | POA: Diagnosis not present

## 2020-03-28 DIAGNOSIS — R456 Violent behavior: Secondary | ICD-10-CM | POA: Diagnosis not present

## 2020-03-28 LAB — URINALYSIS, ROUTINE W REFLEX MICROSCOPIC
Bilirubin Urine: NEGATIVE
Glucose, UA: NEGATIVE mg/dL
Hgb urine dipstick: NEGATIVE
Ketones, ur: NEGATIVE mg/dL
Nitrite: NEGATIVE
Protein, ur: NEGATIVE mg/dL
Specific Gravity, Urine: 1.021 (ref 1.005–1.030)
pH: 5 (ref 5.0–8.0)

## 2020-03-28 LAB — CBC WITH DIFFERENTIAL/PLATELET
Abs Immature Granulocytes: 0.01 10*3/uL (ref 0.00–0.07)
Basophils Absolute: 0 10*3/uL (ref 0.0–0.1)
Basophils Relative: 1 %
Eosinophils Absolute: 0.3 10*3/uL (ref 0.0–0.5)
Eosinophils Relative: 5 %
HCT: 32.3 % — ABNORMAL LOW (ref 36.0–46.0)
Hemoglobin: 9.8 g/dL — ABNORMAL LOW (ref 12.0–15.0)
Immature Granulocytes: 0 %
Lymphocytes Relative: 45 %
Lymphs Abs: 2.8 10*3/uL (ref 0.7–4.0)
MCH: 26.2 pg (ref 26.0–34.0)
MCHC: 30.3 g/dL (ref 30.0–36.0)
MCV: 86.4 fL (ref 80.0–100.0)
Monocytes Absolute: 0.6 10*3/uL (ref 0.1–1.0)
Monocytes Relative: 10 %
Neutro Abs: 2.4 10*3/uL (ref 1.7–7.7)
Neutrophils Relative %: 39 %
Platelets: 301 10*3/uL (ref 150–400)
RBC: 3.74 MIL/uL — ABNORMAL LOW (ref 3.87–5.11)
RDW: 21.3 % — ABNORMAL HIGH (ref 11.5–15.5)
WBC: 6.1 10*3/uL (ref 4.0–10.5)
nRBC: 0 % (ref 0.0–0.2)

## 2020-03-28 LAB — COMPREHENSIVE METABOLIC PANEL
ALT: 20 U/L (ref 0–44)
AST: 25 U/L (ref 15–41)
Albumin: 3.8 g/dL (ref 3.5–5.0)
Alkaline Phosphatase: 132 U/L — ABNORMAL HIGH (ref 38–126)
Anion gap: 12 (ref 5–15)
BUN: 18 mg/dL (ref 8–23)
CO2: 21 mmol/L — ABNORMAL LOW (ref 22–32)
Calcium: 9.4 mg/dL (ref 8.9–10.3)
Chloride: 107 mmol/L (ref 98–111)
Creatinine, Ser: 0.59 mg/dL (ref 0.44–1.00)
GFR, Estimated: >60 mL/min (ref >60)
Glucose, Bld: 99 mg/dL (ref 70–99)
Potassium: 4 mmol/L (ref 3.5–5.1)
Sodium: 140 mmol/L (ref 135–145)
Total Bilirubin: 0.7 mg/dL (ref 0.3–1.2)
Total Protein: 7.2 g/dL (ref 6.5–8.1)

## 2020-03-28 LAB — RESP PANEL BY RT-PCR (FLU A&B, COVID) ARPGX2
Influenza A by PCR: NEGATIVE
Influenza B by PCR: NEGATIVE
SARS Coronavirus 2 by RT PCR: NEGATIVE

## 2020-03-28 LAB — ETHANOL: Alcohol, Ethyl (B): 162 mg/dL — ABNORMAL HIGH (ref <10)

## 2020-03-28 MED ORDER — HALOPERIDOL LACTATE 5 MG/ML IJ SOLN
2.0000 mg | Freq: Once | INTRAMUSCULAR | Status: AC
  Administered 2020-03-28: 2 mg via INTRAMUSCULAR
  Filled 2020-03-28: qty 1

## 2020-03-28 MED ORDER — LISINOPRIL-HYDROCHLOROTHIAZIDE 20-12.5 MG PO TABS: 1.0000 | ORAL_TABLET | Freq: Every day | ORAL | Status: DC

## 2020-03-28 MED ORDER — HYDROCHLOROTHIAZIDE 12.5 MG PO CAPS
12.5000 mg | ORAL_CAPSULE | Freq: Every day | ORAL | Status: DC
  Administered 2020-03-29 – 2020-03-31 (×3): 12.5 mg via ORAL
  Filled 2020-03-28 (×3): qty 1

## 2020-03-28 MED ORDER — IBUPROFEN 800 MG PO TABS
800.0000 mg | ORAL_TABLET | Freq: Three times a day (TID) | ORAL | Status: DC
Start: 2020-03-28 — End: 2020-04-01
  Administered 2020-03-28 – 2020-03-31 (×8): 800 mg via ORAL
  Filled 2020-03-28 (×8): qty 1

## 2020-03-28 MED ORDER — LISINOPRIL 20 MG PO TABS
20.0000 mg | ORAL_TABLET | Freq: Every day | ORAL | Status: DC
  Administered 2020-03-29 – 2020-03-31 (×3): 20 mg via ORAL
  Filled 2020-03-28 (×3): qty 1

## 2020-03-28 MED ORDER — CALCIUM CARBONATE-VITAMIN D 500-200 MG-UNIT PO TABS
1.0000 | ORAL_TABLET | Freq: Every day | ORAL | Status: DC
  Administered 2020-03-30 – 2020-03-31 (×2): 1 via ORAL
  Filled 2020-03-28 (×3): qty 1

## 2020-03-28 MED ORDER — SIMVASTATIN 10 MG PO TABS
10.0000 mg | ORAL_TABLET | Freq: Every day | ORAL | Status: DC
  Administered 2020-03-28 – 2020-03-30 (×3): 10 mg via ORAL
  Filled 2020-03-28 (×3): qty 1
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
## 2020-03-29 DIAGNOSIS — R410 Disorientation, unspecified: Secondary | ICD-10-CM | POA: Diagnosis not present

## 2020-03-29 DIAGNOSIS — F0281 Dementia in other diseases classified elsewhere with behavioral disturbance: Secondary | ICD-10-CM | POA: Diagnosis not present

## 2020-03-29 DIAGNOSIS — F29 Unspecified psychosis not due to a substance or known physiological condition: Secondary | ICD-10-CM | POA: Diagnosis not present

## 2020-03-29 LAB — RAPID URINE DRUG SCREEN, HOSP PERFORMED
Amphetamines: NOT DETECTED
Barbiturates: NOT DETECTED
Benzodiazepines: NOT DETECTED
Cocaine: NOT DETECTED
Opiates: NOT DETECTED
Tetrahydrocannabinol: NOT DETECTED

## 2020-03-29 MED ORDER — QUETIAPINE FUMARATE 25 MG PO TABS
12.5000 mg | ORAL_TABLET | Freq: Every day | ORAL | Status: DC
  Administered 2020-03-29: 12.5 mg via ORAL
  Filled 2020-03-29: qty 1
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
## 2020-03-29 NOTE — ED Provider Notes (Signed)
Emergency Medicine Observation Re-evaluation Note  Merrill Deanda is a 70 y.o. female, seen on rounds today.  Pt initially presented to the ED for complaints of Psychiatric Evaluation Currently, the patient is awaiting placement.  Physical Exam  BP (!) 150/62 (BP Location: Right Arm)   Pulse 67   Temp 98.4 F (36.9 C) (Oral)   Resp 20   Ht 5\' 6"  (1.676 m)   Wt 64.4 kg   SpO2 100%   BMI 22.92 kg/m  Physical Exam General: Resting comfortably Cardiac: No murmur on auscultation Lungs: Clear Psych: Resting in no distress  ED Course / MDM  EKG:    I have reviewed the labs performed to date as well as medications administered while in observation.  Recent changes in the last 24 hours include none.  Plan  Current plan is for placement. Patient is under full IVC at this time.   Tarence Searcy, Gwenyth Allegra, MD 03/29/20 [phone]

## 2020-03-29 NOTE — ED Notes (Addendum)
Restraints removed, patient resting comfortably. Vital signs within normal limits, no bruising or discoloration noted to all four extremities.
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
## 2020-03-29 NOTE — Care Management (Signed)
Per Irine Seal there are no available beds at [facility]. Per Margorie John, PA-C who recommended inpatient geriatric-psychiatry treatment. Writer sent out referrals to the following facilities:     [facility] Details  Fax           [address], [place] [address]     Internal comment    [facility] Details  [address] [facility] [address] [place] [address]     Internal comment    [facility] Details  Fax         [address] [place] [address]     Internal comment    [facility] Details  Fax         [address], [place] [address]     Internal comment

## 2020-03-29 NOTE — ED Notes (Signed)
Patient has 2 patient belonging bags placed in the patient belongings cabinet above the nurses station for 19-22.

## 2020-03-30 ENCOUNTER — Encounter (HOSPITAL_COMMUNITY): Payer: Self-pay | Admitting: Registered Nurse

## 2020-03-30 DIAGNOSIS — R4182 Altered mental status, unspecified: Secondary | ICD-10-CM | POA: Diagnosis not present

## 2020-03-30 DIAGNOSIS — F0281 Dementia in other diseases classified elsewhere with behavioral disturbance: Secondary | ICD-10-CM | POA: Diagnosis not present

## 2020-03-30 DIAGNOSIS — Z789 Other specified health status: Secondary | ICD-10-CM

## 2020-03-30 LAB — URINE CULTURE

## 2020-03-30 MED ORDER — QUETIAPINE FUMARATE 25 MG PO TABS
25.0000 mg | ORAL_TABLET | Freq: Every day | ORAL | Status: DC
  Administered 2020-03-30: 25 mg via ORAL
  Filled 2020-03-30: qty 1

## 2020-03-30 NOTE — Consult Note (Signed)
[facility] Consult   Reason for Consult:  Aggressive behavior and confusion Referring Physician:  Lacretia Leigh, MD Patient Identification: Tahja Liao MRN:  [id number] Principal Diagnosis: Dementia with aggressive behavior ([place]) Diagnosis:  Principal Problem:   Dementia with aggressive behavior ([place]) Active Problems:   Decreased activities of daily living (ADL)   Total Time spent with patient: 45 minutes  Subjective:   Bergen Melle is a 70 y.o. female patient admitted to [facility] ED after presenting with complaints of progressive confusion and aggression.  HPI:  Anarie Kalish, 70 y.o., female patient seen face to face by this provider, consulted with Dr. Hampton Abbot; and chart reviewed on 03/30/20.  On evaluation Amarea Macdowell reports she was in the bathroom "I heard a beep, beep and it was the fire alarm.  I must have left a burner on the stove on.  I tried to put it out but couldn't.  Patient denies prior psychiatric history and states that she is not taking any psychotropic medications.  Patient states that she lives a lone but gets most of her family support from her daughter Terressa Koyanagi.  Patient gave permission to speak to her daughter for collateral information.  During evaluation Darling Cieslewicz is sitting up in bed in no acute distress.  She is alert, oriented to self and place.  Patient is able to give the correct day of the week but not the month or year.  Patient could not name the current president and became upset with questions related to memory "I don't deal with stuff like that.  I got a problem with my memory."  Patient remained mostly calm and cooperative throughout assessment.  Her mood is euthymic with congruent affect.  She does not appear to be responding to internal/external stimuli or delusional thoughts.  Patient denies suicidal/self-harm/homicidal ideation, psychosis, and paranoia.    Collateral Information:  Patients daughter is at hospital [facility]).  Ms. Oletta Lamas states that her mother lives alone and family stops in to check on her.  States that she has had a problem with her memory for the last 2 years but has gotten worse.  States that she attempted to get guardianship related to mother getting angry when ever they tried to care for her.  States that she is not able to cook, clear, or care for self at home alone anymore.  States that DSS was granted guardianship of patient. States that patient does have a psychiatric history.  "She was admitted to [facility] in [facility].  I don't know what for; but she hasn't had any problems since.  No she is not taking medicine for mental health."  States that the DSS work wants to have her mother placed in memory care and she is in agreement.  Doesn't feel that it is psychiatric but more that patient is unable to care for herself at home alone anymore and needs to be in nursing home or memory care.   Past Psychiatric History: Aggressive behavior and psychiatric hospitalization 1996 but unsure of diagnosis  Risk to Self:  Yes.  Patient unable to care for herself at home alone Risk to Others:  No Prior Inpatient Therapy:  Yes Prior Outpatient Therapy:  Yes  Past Medical History:  Past Medical History:  Diagnosis Date  . Arthritis   . Hypertension    History reviewed. No pertinent surgical history.   Family History: History reviewed. No pertinent family history. Family Psychiatric  History: Daughter denies a family history of psychiatric issues that she is aware of.  Social History:  Social History   Substance and Sexual Activity  Alcohol Use Yes  . Alcohol/week: 2.0 standard drinks  . Types: 2 Cans of beer per week     Social History   Substance and Sexual Activity  Drug Use Not on file    Social History   Socioeconomic History  . Marital status: Divorced    Spouse name: Not on file  . Number of children: Not on file  . Years  of education: Not on file  . Highest education level: Not on file  Occupational History  . Not on file  Tobacco Use  . Smoking status: Current Every Day Smoker    Packs/day: 0.25  . Smokeless tobacco: Not on file  Substance and Sexual Activity  . Alcohol use: Yes    Alcohol/week: 2.0 standard drinks    Types: 2 Cans of beer per week  . Drug use: Not on file  . Sexual activity: Not on file  Other Topics Concern  . Not on file  Social History Narrative  . Not on file   Social Determinants of Health   Financial Resource Strain: Not on file  Food Insecurity: Not on file  Transportation Needs: Not on file  Physical Activity: Not on file  Stress: Not on file  Social Connections: Not on file   Additional Social History:    Allergies:  No Known Allergies  Labs:  Results for orders placed or performed during the hospital encounter of 03/28/20 (from the past 48 hour(s))  Urinalysis, Routine w reflex microscopic     Status: Abnormal   Collection Time: 03/28/20  7:46 PM  Result Value Ref Range   Color, Urine YELLOW YELLOW   APPearance CLOUDY (A) CLEAR   Specific Gravity, Urine 1.021 1.005 - 1.030   pH 5.0 5.0 - 8.0   Glucose, UA NEGATIVE NEGATIVE mg/dL   Hgb urine dipstick NEGATIVE NEGATIVE   Bilirubin Urine NEGATIVE NEGATIVE   Ketones, ur NEGATIVE NEGATIVE mg/dL   Protein, ur NEGATIVE NEGATIVE mg/dL   Nitrite NEGATIVE NEGATIVE   Leukocytes,Ua LARGE (A) NEGATIVE   RBC / HPF 0-5 0 - 5 RBC/hpf   WBC, UA 11-20 0 - 5 WBC/hpf   Bacteria, UA RARE (A) NONE SEEN   Squamous Epithelial / LPF 11-20 0 - 5   Mucus PRESENT     Comment: Performed at [facility], [place] [address] [place], [place] [address]  Urine Culture     Status: Abnormal   Collection Time: 03/28/20  7:47 PM   Specimen: Urine, Clean Catch  Result Value Ref Range   Specimen Description      URINE, CLEAN CATCH Performed at [facility], [place] [address] [place], [place]  [address]    Special Requests      NONE Performed at [facility], [place] [address] [place], [place] [address]    Culture MULTIPLE SPECIES PRESENT, SUGGEST RECOLLECTION (A)    Report Status 03/30/2020 FINAL   Ethanol     Status: Abnormal   Collection Time: 03/28/20  8:40 PM  Result Value Ref Range   Alcohol, Ethyl (B) 162 (H) <10 mg/dL    Comment: (NOTE) Lowest detectable limit for serum alcohol is 10 mg/dL.  For medical purposes only. Performed at [facility], [place] [address], [place], [place] [address]   CBC with Differential/Platelet  Status: Abnormal   Collection Time: 03/28/20  8:40 PM  Result Value Ref Range   WBC 6.1 4.0 - 10.5 K/uL   RBC 3.74 (L) 3.87 - 5.11 MIL/uL   Hemoglobin 9.8 (L) 12.0 - 15.0 g/dL   HCT 32.3 (L) 36.0 - 46.0 %   MCV 86.4 80.0 - 100.0 fL   MCH 26.2 26.0 - 34.0 pg   MCHC 30.3 30.0 - 36.0 g/dL   RDW 21.3 (H) 11.5 - 15.5 %   Platelets 301 150 - 400 K/uL   nRBC 0.0 0.0 - 0.2 %   Neutrophils Relative % 39 %   Neutro Abs 2.4 1.7 - 7.7 K/uL   Lymphocytes Relative 45 %   Lymphs Abs 2.8 0.7 - 4.0 K/uL   Monocytes Relative 10 %   Monocytes Absolute 0.6 0.1 - 1.0 K/uL   Eosinophils Relative 5 %   Eosinophils Absolute 0.3 0.0 - 0.5 K/uL   Basophils Relative 1 %   Basophils Absolute 0.0 0.0 - 0.1 K/uL   Immature Granulocytes 0 %   Abs Immature Granulocytes 0.01 0.00 - 0.07 K/uL   Polychromasia PRESENT    Target Cells PRESENT     Comment: Performed at [facility], [place] [address], [place], [place] [address]  Comprehensive metabolic panel     Status: Abnormal   Collection Time: 03/28/20  8:40 PM  Result Value Ref Range   Sodium 140 135 - 145 mmol/L   Potassium 4.0 3.5 - 5.1 mmol/L   Chloride 107 98 - 111 mmol/L   CO2 21 (L) 22 - 32 mmol/L   Glucose, Bld 99 70 - 99 mg/dL    Comment: Glucose reference range applies only to samples taken after fasting for at least 8 hours.   BUN 18 8 - 23 mg/dL    Creatinine, Ser 0.59 0.44 - 1.00 mg/dL   Calcium 9.4 8.9 - 10.3 mg/dL   Total Protein 7.2 6.5 - 8.1 g/dL   Albumin 3.8 3.5 - 5.0 g/dL   AST 25 15 - 41 U/L   ALT 20 0 - 44 U/L   Alkaline Phosphatase 132 (H) 38 - 126 U/L   Total Bilirubin 0.7 0.3 - 1.2 mg/dL   GFR, Estimated >60 >60 mL/min    Comment: (NOTE) Calculated using the CKD-EPI Creatinine Equation (2021)    Anion gap 12 5 - 15    Comment: Performed at [facility], [place] [address] [place], [place] [address]  Resp Panel by RT-PCR (Flu A&B, Covid) Nasopharyngeal Swab     Status: None   Collection Time: 03/28/20 10:09 PM   Specimen: Nasopharyngeal Swab; Nasopharyngeal(NP) swabs in vial transport medium  Result Value Ref Range   SARS Coronavirus 2 by RT PCR NEGATIVE NEGATIVE    Comment: (NOTE) SARS-CoV-2 target nucleic acids are NOT DETECTED.  The SARS-CoV-2 RNA is generally detectable in upper respiratory specimens during the acute phase of infection. The lowest concentration of SARS-CoV-2 viral copies this assay can detect is 138 copies/mL. A negative result does not preclude SARS-Cov-2 infection and should not be used as the sole basis for treatment or other patient management decisions. A negative result may occur with  improper specimen collection/handling, submission of specimen other than nasopharyngeal swab, presence of viral mutation(s) within the areas targeted by this assay, and inadequate number of viral copies(<138 copies/mL). A negative result must be combined with clinical observations, patient history, and epidemiological information. The expected result is Negative.  Fact Sheet for  Patients:  EntrepreneurPulse.com.au  Fact Sheet for Healthcare Providers:  IncredibleEmployment.be  This test is no t yet approved or cleared by the [place] FDA and  has been authorized for detection and/or diagnosis of SARS-CoV-2 by FDA under an Emergency Use  Authorization (EUA). This EUA will remain  in effect (meaning this test can be used) for the duration of the COVID-19 declaration under Section 564(b)(1) of the Act, 21 U.S.C.section 360bbb-3(b)(1), unless the authorization is terminated  or revoked sooner.       Influenza A by PCR NEGATIVE NEGATIVE   Influenza B by PCR NEGATIVE NEGATIVE    Comment: (NOTE) The Xpert Xpress SARS-CoV-2/FLU/RSV plus assay is intended as an aid in the diagnosis of influenza from Nasopharyngeal swab specimens and should not be used as a sole basis for treatment. Nasal washings and aspirates are unacceptable for Xpert Xpress SARS-CoV-2/FLU/RSV testing.  Fact Sheet for Patients: EntrepreneurPulse.com.au  Fact Sheet for Healthcare Providers: IncredibleEmployment.be  This test is not yet approved or cleared by the [place] FDA and has been authorized for detection and/or diagnosis of SARS-CoV-2 by FDA under an Emergency Use Authorization (EUA). This EUA will remain in effect (meaning this test can be used) for the duration of the COVID-19 declaration under Section 564(b)(1) of the Act, 21 U.S.C. section 360bbb-3(b)(1), unless the authorization is terminated or revoked.  Performed at [facility], [place] [address] [place], [place] [address]   Rapid urine drug screen (hospital performed)     Status: None   Collection Time: 03/29/20  7:15 AM  Result Value Ref Range   Opiates NONE DETECTED NONE DETECTED   Cocaine NONE DETECTED NONE DETECTED   Benzodiazepines NONE DETECTED NONE DETECTED   Amphetamines NONE DETECTED NONE DETECTED   Tetrahydrocannabinol NONE DETECTED NONE DETECTED   Barbiturates NONE DETECTED NONE DETECTED    Comment: (NOTE) DRUG SCREEN FOR MEDICAL PURPOSES ONLY.  IF CONFIRMATION IS NEEDED FOR ANY PURPOSE, NOTIFY LAB WITHIN 5 DAYS.  LOWEST DETECTABLE LIMITS FOR URINE DRUG SCREEN Drug Class                     Cutoff  (ng/mL) Amphetamine and metabolites    1000 Barbiturate and metabolites    200 Benzodiazepine                 734 Tricyclics and metabolites     300 Opiates and metabolites        300 Cocaine and metabolites        300 THC                            50 Performed at [facility], [place] [address], [place], [place] [address]     Current Facility-Administered Medications  Medication Dose Route Frequency Provider Last Rate Last Admin  . calcium-vitamin D (OSCAL WITH D) 500-200 MG-UNIT per tablet 1 tablet  1 tablet Oral Q breakfast Lacretia Leigh, MD   1 tablet at 03/30/20 0900  . lisinopril (ZESTRIL) tablet 20 mg  20 mg Oral Daily Lacretia Leigh, MD   20 mg at 03/30/20 1157   And  . hydrochlorothiazide (MICROZIDE) capsule 12.5 mg  12.5 mg Oral Daily Lacretia Leigh, MD   12.5 mg at 03/30/20 0905  . ibuprofen (ADVIL) tablet 800 mg  800 mg Oral TID Lacretia Leigh, MD   800 mg at 03/30/20 0906  . QUEtiapine (SEROQUEL) tablet 25 mg  25  mg Oral QHS Kaaliyah Kita B, NP      . simvastatin (ZOCOR) tablet 10 mg  10 mg Oral QHS Lacretia Leigh, MD   10 mg at 03/29/20 2200   Current Outpatient Medications  Medication Sig Dispense Refill  . celecoxib (CELEBREX) 100 MG capsule Take 1 capsule (100 mg total) by mouth 2 (two) times daily. (Patient not taking: Reported on 03/29/2020) 60 capsule 3  . cyclobenzaprine (FLEXERIL) 10 MG tablet Take 1 tablet (10 mg total) by mouth 3 (three) times daily as needed for muscle spasms. (Patient not taking: Reported on 03/29/2020) 60 tablet 3  . HYDROcodone-acetaminophen (NORCO/VICODIN) 5-325 MG tablet Take 1-2 tablets by mouth every 6 (six) hours as needed for severe pain. (Patient not taking: Reported on 03/29/2020) 12 tablet 0  . morphine (MSIR) 15 MG tablet Take 0.5 tablets (7.5 mg total) by mouth every 4 (four) hours as needed for severe pain. (Patient not taking: Reported on 03/29/2020) 3 tablet 0  . tobramycin (TOBREX) 0.3 % ophthalmic solution Place  1 drop into both eyes every 6 (six) hours. (Patient not taking: Reported on 03/29/2020) 5 mL 1  . traMADol (ULTRAM) 50 MG tablet Take 1 tablet (50 mg total) by mouth every 6 (six) hours as needed. (Patient not taking: Reported on 03/29/2020) 60 tablet 0    Musculoskeletal: Strength & Muscle Tone: within normal limits Gait & Station: unsteady, Patient was able to ambulated to bathroom with assistance; mainly standing close by to prevent fall Patient leans: N/A  Psychiatric Specialty Exam: Physical Exam  Review of Systems  Blood pressure 120/77, pulse 68, temperature 98.8 F (37.1 C), temperature source Oral, resp. rate 16, height 5\' 6"  (1.676 m), weight 64.4 kg, SpO2 97 %.Body mass index is 22.92 kg/m.  General Appearance: Casual  Eye Contact:  Good  Speech:  Clear and Coherent and Normal Rate  Volume:  Normal  Mood:  Euthymic  Affect:  Congruent  Thought Process:  Coherent and Linear  Orientation:  Other:  Oriented to self and place  Thought Content:  There is no indication that patient is responding to internal stimuli or experriencing delusional thoughts  Suicidal Thoughts:  No  Homicidal Thoughts:  No  Memory:  Immediate;   Poor Recent;   Poor Remote;   Poor  Judgement:  Poor  Insight:  Fair and Present  Psychomotor Activity:  Normal  Concentration:  Concentration: Fair and Attention Span: Fair  Recall:  Poor  Fund of Knowledge:  Fair  Language:  Good  Akathisia:  No  Handed:  Right  AIMS (if indicated):     Assets:  Communication Skills Desire for Improvement Leisure Time Resilience Social Support  ADL's:  Impaired  Cognition:  WNL  Sleep:      Patient with chronicle history of memory problems that have gradually gotten worse over the years.  Patient is no longer able to care for herself at home and was recently DSS assumed guardianship of patient.  There has been some aggressive behavior but daughter of patient reports more like patient getting angry when they try  to care for her like giving her a bath.   Patients family and DSS guardian are seeking skill nursing or memory care placement.  Treatment Plan Summary: Plan Psychiatrically cleared.  Will refer to social work for skilled nursing facility or memory care facility placement.    Medication Management:  Patient currently taking Seroquel 12.5 mg Q hs.  Increased to Seroquel 25 mg Q hs.  Recommendation:  Will order consult for social work/transition care for referral to neurology:  Patient hasn't seen neurologist related to memory issues and referral to primary care:  Patient has no primary care physician; and to assist the patients family and DSS guardian with information and/or referral to skilled nursing or memory care facility.    Behavioral health coordinator will refer to outpatient psychiatric services.    Disposition:  Psychiatrically cleared No evidence of imminent risk to self or others at present.   Patient does not meet criteria for psychiatric inpatient admission. Supportive therapy provided about ongoing stressors.  Secure message sent to Dr. Johnney Killian informing:  Patient seen and psychiatrically cleared.  Patient will need a referral to neurology and referral for a primary PCP.  I've order social work consult to assist family and DSS guardian with skilled nursing or memory care assistance and/or resources.  I did increase Seroquel to 25 mg Q hs but no other changes.     Dominigue Gellner, NP 03/30/2020 2:04 PM

## 2020-03-30 NOTE — Discharge Instructions (Signed)
For your behavioral health needs you are advised to follow up with an outpatient psychiatrist specializing it the care of older adults.  Contact one of the following providers at your earliest opportunity to scheduled an intake appointment:  Norma Fredrickson, MD sees patients at the following locations:       [facility]. [address]. [place], [place] [address]      [phone]       [facility]      [address] [place] [address]      [place], [place] [address]      [phone]  Leanord Hawking, MD sees patients at his own practice:       Leanord Hawking, MD      [facility]      [address] [place]      [place], [place] [address]      [phone]

## 2020-03-30 NOTE — Progress Notes (Signed)
..   Transition of Care [facility]) - Emergency Department Mini Assessment   Patient Details  Name: Carol Merritt MRN: [id number] Date of Birth: May 11, 1950  Transition [facility]) CM/SW Contact:    Gaetano Hawthorne Tarpley-Carter, LCSWA Phone Number: 03/30/2020, 3:20 PM   Clinical Narrative: [facility] CM/CSW consulted with pts legal guardian/ Briana Bibbs [phone].  Lesly Rubenstein was notified that pt was ready for pick up, pt has been medically and psychiatrically cleared.  Lesly Rubenstein stated that she would have to contact family to pick up pt.  CSW stated to Lesly Rubenstein that regardless of who can pick up pt, pt is your ward and has to be picked up by 5pm today.  Lesly Rubenstein stated she would call CSW back.  Artis Buechele Tarpley-Carter, MSW, LCSW-A Pronouns:  She, Her, Hers                  [place] [place] ED Transitions of CareClinical Social Worker [email or web address] [phone]  ED Mini Assessment: What brought you to the Emergency Department? : Progressive confusion, agitation, and inability to live independently  Barriers to Discharge: No Barriers Identified     Means of departure: Not know  Interventions which prevented an admission or readmission: Other (must enter comment),Transportation Screening (Contact legal guardian)    Patient Contact and Communications Key Contact 1: Inez Catalina   Spoke with: [phone] or [phone] Contact Date: 03/30/20,   Contact time: 0200   Call outcome: Lesly Rubenstein was notified that pt was ready for pick up.    CMS Medicare.gov Compare Post Acute Care list provided to:: Legal Guardian Choice offered to / list presented to : NA  Admission diagnosis:  Medical Clearance and Wrist Injury and Foot Pain  Patient Active Problem List   Diagnosis Date Noted  . Decreased activities of daily living (ADL) 03/30/2020  . Altered mental status 03/30/2020  . Pain in hip joint 11/13/2014  . Dermoid cyst of face 11/13/2014  . Primary osteoarthritis of  right knee 09/09/2013  . Bilateral low back pain without sciatica 09/09/2013  . Accelerated hypertension 10/09/2012  . Osteoarthritis of right knee 10/09/2012  . Nicotine dependence 10/09/2012   PCP:  Patient, No Pcp Per Pharmacy:   [facility] [address] [place] [address] Phone: [phone] Fax: [phone]  [id number] - [place], [place] - [address] [place] [address] Phone: [phone] Fax: [phone]  [place] - [address] [place] [address] Phone: [phone] Fax: [phone]

## 2020-03-30 NOTE — ED Notes (Signed)
Patient cooperative throughout the night.

## 2020-03-30 NOTE — TOC Progression Note (Signed)
CSW has reached out to Carol Merritt/pts legal guardian [phone] and [phone] inquiring about ETA of when someone will arrive to pick pt up. CSW left vm for Ms. Merritt to f/u.

## 2020-03-30 NOTE — Progress Notes (Signed)
03/30/2020  [facility] from [place].

## 2020-03-30 NOTE — BH Assessment (Addendum)
[place] Assessment Progress Note  Per Hampton Abbot, MD, this pt does not require psychiatric hospitalization at this time.  Pt presents under IVC initiated by EDP Lacretia Leigh, MD which has been rescinded by Dr Dwyane Dee.  Pt is psychiatrically cleared.  Discharge instructions include referral information for area outpatient geriatric psychiatrists.  A TOC consult has been ordered for assistance with memory care, neurology and primary care.  EDP Charlesetta Shanks, MD and pt's nurse, Tilda Franco, have been notified, as well as TOC team.  At 13:50 this writer called pt's putative legal guardian, Inez Catalina [phone]), with DSS to inform her of disposition.  She verbally confirms that she is guardian, and agrees to fax letter of guardianship to me at [phone].  Receipt is pending as of this writig.  Jalene Mullet, [place] Triage Specialist [phone]

## 2020-03-31 DIAGNOSIS — F0281 Dementia in other diseases classified elsewhere with behavioral disturbance: Secondary | ICD-10-CM | POA: Diagnosis not present

## 2020-03-31 NOTE — Progress Notes (Signed)
03/31/2020 1421  PT here to see patient.

## 2020-03-31 NOTE — Progress Notes (Signed)
TOC CM/CSW attempted contacting Briana Bibbs/DSS legal guardian [phone].  CSW left HIPPA compliant message with my contact information.  CSW will continue to follow for dc needs.  Phala Schraeder Tarpley-Carter, MSW, LCSW-A Pronouns:  She, Her, Hers                  [place] ED Transitions of CareClinical Social Worker [email or web address] [phone]

## 2020-03-31 NOTE — Progress Notes (Signed)
03/31/2020  1400 Paged PT [phone] for consult. Speak with Cecille Rubin. Cecille Rubin going to contact Ivin Booty to get someone to come see the patient.

## 2020-03-31 NOTE — Progress Notes (Signed)
TOC CM/CSW attempted to contact [place] Bibbs supervisor [phone].  CSW left HIPPA compliant message with my contact information.  CSW will continue to follow for dc needs.  Anila Bojarski Tarpley-Carter, MSW, LCSW-A Pronouns:  She, Her, [place] ED Transitions of CareClinical Social Worker [email or web address] [phone]

## 2020-03-31 NOTE — Progress Notes (Signed)
TOC CM/CSW attempted to contact [place] Bibbs/DSS legal guardian [phone].  CSW left HIPPA compliant message with my contact information.  CSW will continue to follow for dc needs.  Hassie Mandt Tarpley-Carter, MSW, LCSW-A Pronouns:  She, Her, [place] ED Transitions of CareClinical Social Worker [email or web address] [phone]

## 2020-03-31 NOTE — Progress Notes (Signed)
PT Note  Patient Details Name: Carol Merritt MRN: [id number] DOB: [birth year]       Called down from units to see patient ASAP. Pt has been walking in room and in hallway independently per nurse and NT. I did go in to speak to pt who is only alert and oriented to name and DOB, but very pleasant , follows all commands, just does not have short term memory ( could not remember if she ate lunch). She walked around unit no AD  And no LOB, just perseverating on finding her purse and clothes. She is asking about her family and daughter as well. Very appropriate conversation but needs redirecting and reminding a lot.   No mobility deficits at this time, no further PT needs, signing off.    Clide Dales 03/31/2020, 3:06 PM  Gatha Mayer, PT, MPT Acute Rehabilitation Services Office: [phone] Pager: [phone] 03/31/2020

## 2020-03-31 NOTE — Progress Notes (Signed)
TOC CM/CSW received a call from Briana/[facility] APS, who stated that pts daughter/Felicia Edwards [phone] would pick pt up at 7:30pm once she gets off work at [place] will continue to follow for dc needs.  Nilay Mangrum Tarpley-Carter, MSW, LCSW-A Pronouns:  She, Her, Hers                  [place] [facility] Transitions of CareClinical Social Worker [email or web address] [phone]

## 2020-03-31 NOTE — Progress Notes (Signed)
TOC CM/CSW attempted contacting Carol Merritt/[facility] legal guardian [phone].  CSW left HIPPA compliant message with my contact information.  CSW will continue to follow for dc needs.  Carol Merritt, MSW, LCSW-A Pronouns:  She, Her, Hers                  [place] [facility] Transitions of CareClinical Social Worker [email or web address] [phone]

## 2020-04-01 DIAGNOSIS — F0390 Unspecified dementia without behavioral disturbance: Secondary | ICD-10-CM | POA: Diagnosis not present

## 2020-04-01 DIAGNOSIS — Z029 Encounter for administrative examinations, unspecified: Secondary | ICD-10-CM | POA: Diagnosis not present

## 2020-04-01 DIAGNOSIS — M2550 Pain in unspecified joint: Secondary | ICD-10-CM | POA: Diagnosis not present

## 2020-04-01 DIAGNOSIS — F1010 Alcohol abuse, uncomplicated: Secondary | ICD-10-CM | POA: Diagnosis not present

## 2020-04-06 DIAGNOSIS — I10 Essential (primary) hypertension: Secondary | ICD-10-CM | POA: Diagnosis not present

## 2020-04-06 DIAGNOSIS — M25531 Pain in right wrist: Secondary | ICD-10-CM | POA: Diagnosis not present

## 2020-04-06 DIAGNOSIS — R296 Repeated falls: Secondary | ICD-10-CM | POA: Diagnosis not present

## 2020-04-06 DIAGNOSIS — F0151 Vascular dementia with behavioral disturbance: Secondary | ICD-10-CM | POA: Diagnosis not present

## 2020-04-15 DIAGNOSIS — F419 Anxiety disorder, unspecified: Secondary | ICD-10-CM | POA: Diagnosis not present

## 2020-04-15 DIAGNOSIS — R4189 Other symptoms and signs involving cognitive functions and awareness: Secondary | ICD-10-CM | POA: Diagnosis not present

## 2020-04-23 DIAGNOSIS — F419 Anxiety disorder, unspecified: Secondary | ICD-10-CM | POA: Diagnosis not present

## 2020-04-23 DIAGNOSIS — R4189 Other symptoms and signs involving cognitive functions and awareness: Secondary | ICD-10-CM | POA: Diagnosis not present

## 2020-04-25 DIAGNOSIS — R419 Unspecified symptoms and signs involving cognitive functions and awareness: Secondary | ICD-10-CM | POA: Diagnosis not present

## 2020-04-25 DIAGNOSIS — F4322 Adjustment disorder with anxiety: Secondary | ICD-10-CM | POA: Diagnosis not present

## 2020-04-29 DIAGNOSIS — R4189 Other symptoms and signs involving cognitive functions and awareness: Secondary | ICD-10-CM | POA: Diagnosis not present

## 2020-04-29 DIAGNOSIS — F419 Anxiety disorder, unspecified: Secondary | ICD-10-CM | POA: Diagnosis not present

## 2020-05-07 DIAGNOSIS — M25531 Pain in right wrist: Secondary | ICD-10-CM | POA: Diagnosis not present

## 2020-05-07 DIAGNOSIS — K08109 Complete loss of teeth, unspecified cause, unspecified class: Secondary | ICD-10-CM | POA: Diagnosis not present

## 2020-05-07 DIAGNOSIS — F0151 Vascular dementia with behavioral disturbance: Secondary | ICD-10-CM | POA: Diagnosis not present

## 2020-05-07 DIAGNOSIS — R296 Repeated falls: Secondary | ICD-10-CM | POA: Diagnosis not present

## 2020-05-07 DIAGNOSIS — I10 Essential (primary) hypertension: Secondary | ICD-10-CM | POA: Diagnosis not present

## 2020-06-01 DIAGNOSIS — Z20828 Contact with and (suspected) exposure to other viral communicable diseases: Secondary | ICD-10-CM | POA: Diagnosis not present

## 2020-06-01 DIAGNOSIS — U071 COVID-19: Secondary | ICD-10-CM | POA: Diagnosis not present

## 2020-06-03 DIAGNOSIS — U071 COVID-19: Secondary | ICD-10-CM | POA: Diagnosis not present

## 2020-06-05 DIAGNOSIS — Z20828 Contact with and (suspected) exposure to other viral communicable diseases: Secondary | ICD-10-CM | POA: Diagnosis not present

## 2020-06-10 DIAGNOSIS — R4189 Other symptoms and signs involving cognitive functions and awareness: Secondary | ICD-10-CM | POA: Diagnosis not present

## 2020-06-10 DIAGNOSIS — F419 Anxiety disorder, unspecified: Secondary | ICD-10-CM | POA: Diagnosis not present

## 2020-06-11 DIAGNOSIS — Z20822 Contact with and (suspected) exposure to covid-19: Secondary | ICD-10-CM | POA: Diagnosis not present

## 2020-06-11 DIAGNOSIS — Z20828 Contact with and (suspected) exposure to other viral communicable diseases: Secondary | ICD-10-CM | POA: Diagnosis not present

## 2020-06-11 DIAGNOSIS — I10 Essential (primary) hypertension: Secondary | ICD-10-CM | POA: Diagnosis not present

## 2020-06-19 DIAGNOSIS — Z03818 Encounter for observation for suspected exposure to other biological agents ruled out: Secondary | ICD-10-CM | POA: Diagnosis not present

## 2020-06-19 DIAGNOSIS — F0391 Unspecified dementia with behavioral disturbance: Secondary | ICD-10-CM | POA: Diagnosis not present

## 2020-06-19 DIAGNOSIS — E119 Type 2 diabetes mellitus without complications: Secondary | ICD-10-CM | POA: Diagnosis not present

## 2020-06-19 DIAGNOSIS — U071 COVID-19: Secondary | ICD-10-CM | POA: Diagnosis not present

## 2020-06-19 DIAGNOSIS — R296 Repeated falls: Secondary | ICD-10-CM | POA: Diagnosis not present

## 2020-06-19 DIAGNOSIS — M2550 Pain in unspecified joint: Secondary | ICD-10-CM | POA: Diagnosis not present

## 2020-06-26 DIAGNOSIS — F4322 Adjustment disorder with anxiety: Secondary | ICD-10-CM | POA: Diagnosis not present

## 2020-06-26 DIAGNOSIS — R419 Unspecified symptoms and signs involving cognitive functions and awareness: Secondary | ICD-10-CM | POA: Diagnosis not present

## 2020-07-29 DIAGNOSIS — R4189 Other symptoms and signs involving cognitive functions and awareness: Secondary | ICD-10-CM | POA: Diagnosis not present

## 2020-07-29 DIAGNOSIS — F419 Anxiety disorder, unspecified: Secondary | ICD-10-CM | POA: Diagnosis not present

## 2020-08-08 DIAGNOSIS — R419 Unspecified symptoms and signs involving cognitive functions and awareness: Secondary | ICD-10-CM | POA: Diagnosis not present

## 2020-08-08 DIAGNOSIS — F4322 Adjustment disorder with anxiety: Secondary | ICD-10-CM | POA: Diagnosis not present

## 2020-08-12 DIAGNOSIS — R4189 Other symptoms and signs involving cognitive functions and awareness: Secondary | ICD-10-CM | POA: Diagnosis not present

## 2020-08-12 DIAGNOSIS — F419 Anxiety disorder, unspecified: Secondary | ICD-10-CM | POA: Diagnosis not present

## 2020-08-22 DIAGNOSIS — F4322 Adjustment disorder with anxiety: Secondary | ICD-10-CM | POA: Diagnosis not present

## 2020-08-22 DIAGNOSIS — R419 Unspecified symptoms and signs involving cognitive functions and awareness: Secondary | ICD-10-CM | POA: Diagnosis not present

## 2020-09-05 DIAGNOSIS — R419 Unspecified symptoms and signs involving cognitive functions and awareness: Secondary | ICD-10-CM | POA: Diagnosis not present

## 2020-09-05 DIAGNOSIS — F4322 Adjustment disorder with anxiety: Secondary | ICD-10-CM | POA: Diagnosis not present

## 2020-09-09 DIAGNOSIS — F419 Anxiety disorder, unspecified: Secondary | ICD-10-CM | POA: Diagnosis not present

## 2020-09-09 DIAGNOSIS — R4189 Other symptoms and signs involving cognitive functions and awareness: Secondary | ICD-10-CM | POA: Diagnosis not present

## 2020-09-25 DIAGNOSIS — R419 Unspecified symptoms and signs involving cognitive functions and awareness: Secondary | ICD-10-CM | POA: Diagnosis not present

## 2020-09-25 DIAGNOSIS — F4322 Adjustment disorder with anxiety: Secondary | ICD-10-CM | POA: Diagnosis not present

## 2020-10-05 ENCOUNTER — Telehealth: Payer: Self-pay | Admitting: Internal Medicine

## 2020-10-05 NOTE — Telephone Encounter (Signed)
Tried calling patient  no answer  Updated records and it stated dr sander is pt pcp.  Pt has never been at [facility].  Confirmed pt has pcp or did he want to scheudle @ [facility]

## 2020-10-06 DIAGNOSIS — F419 Anxiety disorder, unspecified: Secondary | ICD-10-CM | POA: Diagnosis not present

## 2020-10-06 DIAGNOSIS — R4189 Other symptoms and signs involving cognitive functions and awareness: Secondary | ICD-10-CM | POA: Diagnosis not present

## 2020-10-09 DIAGNOSIS — R419 Unspecified symptoms and signs involving cognitive functions and awareness: Secondary | ICD-10-CM | POA: Diagnosis not present

## 2020-10-09 DIAGNOSIS — F4322 Adjustment disorder with anxiety: Secondary | ICD-10-CM | POA: Diagnosis not present

## 2020-10-15 IMAGING — DX DG WRIST COMPLETE 3+V*L*
4 series · 4 of 4 positions shown · non-contrast
Comparison: No priors.

CLINICAL DATA: 68-year-old female with history of trauma from a
fall yesterday complaining of left wrist pain.

EXAM:
LEFT WRIST - COMPLETE 3+ VIEW

[wrist pa]
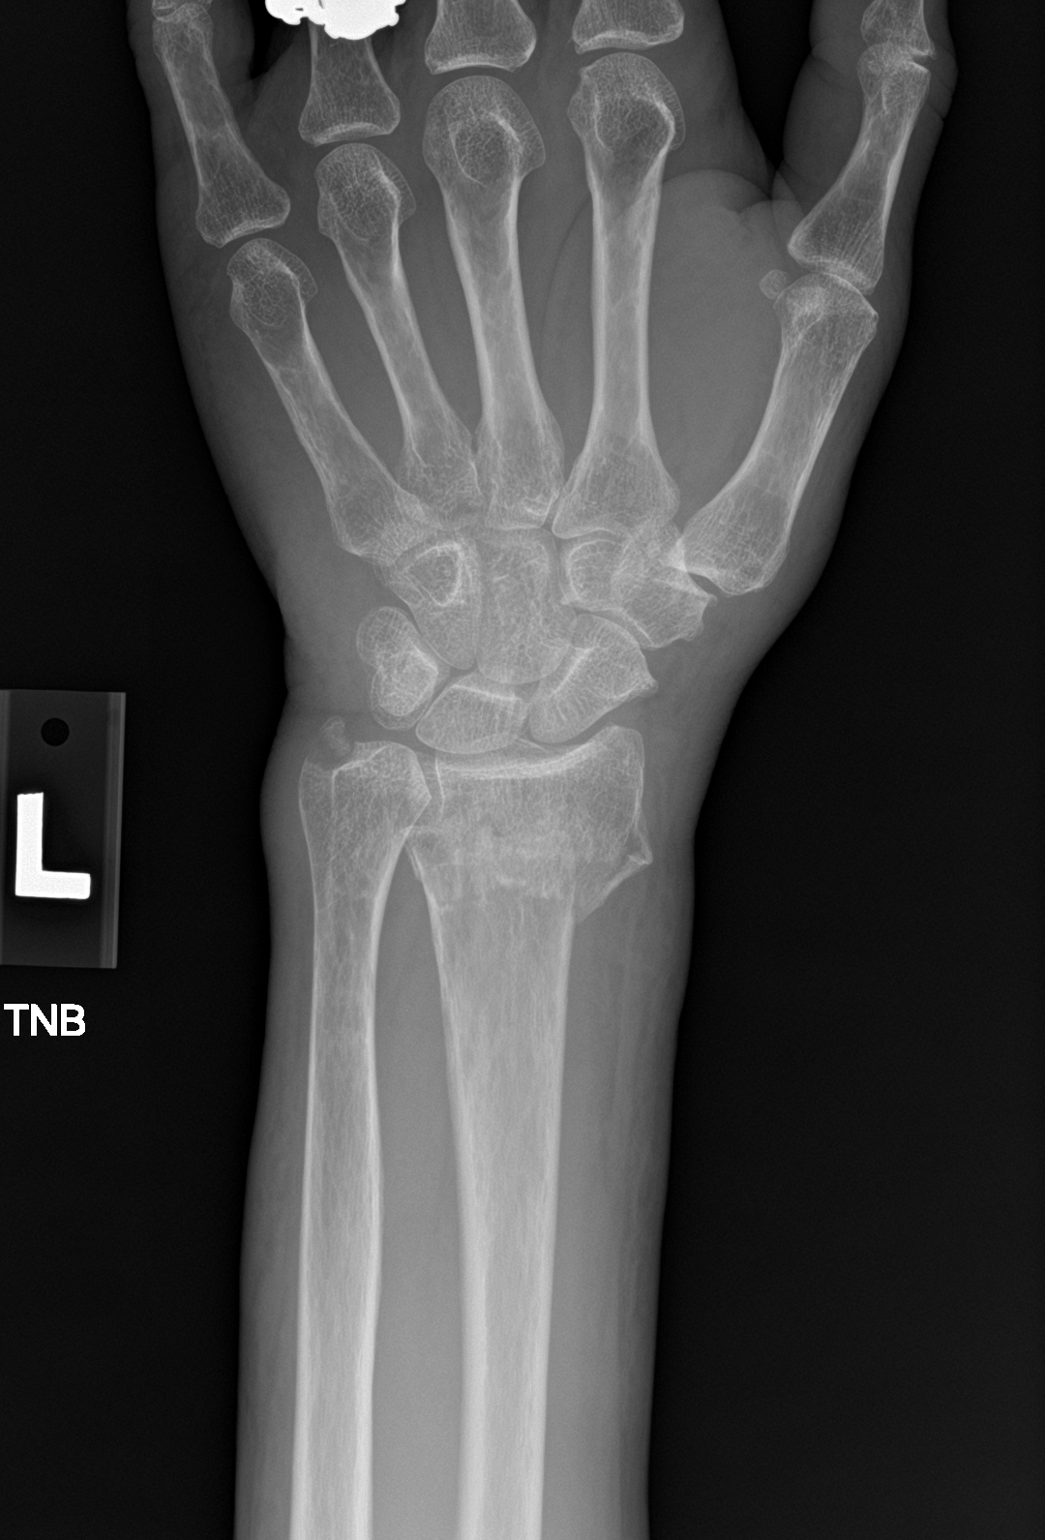

[wrist navicular]
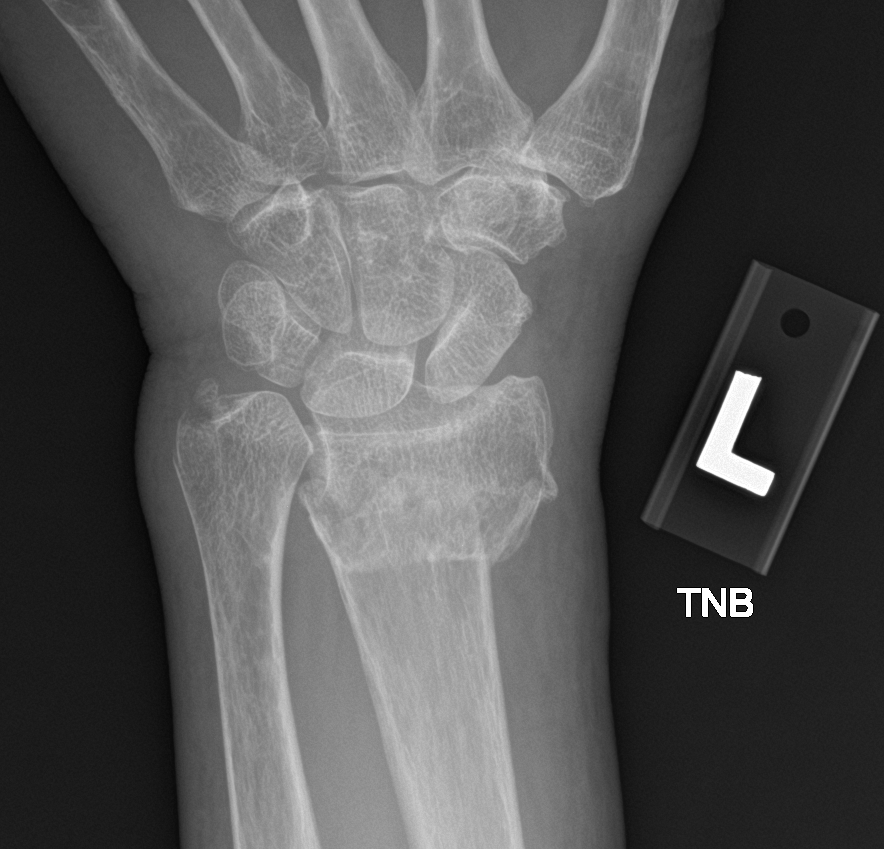

[wrist obl]
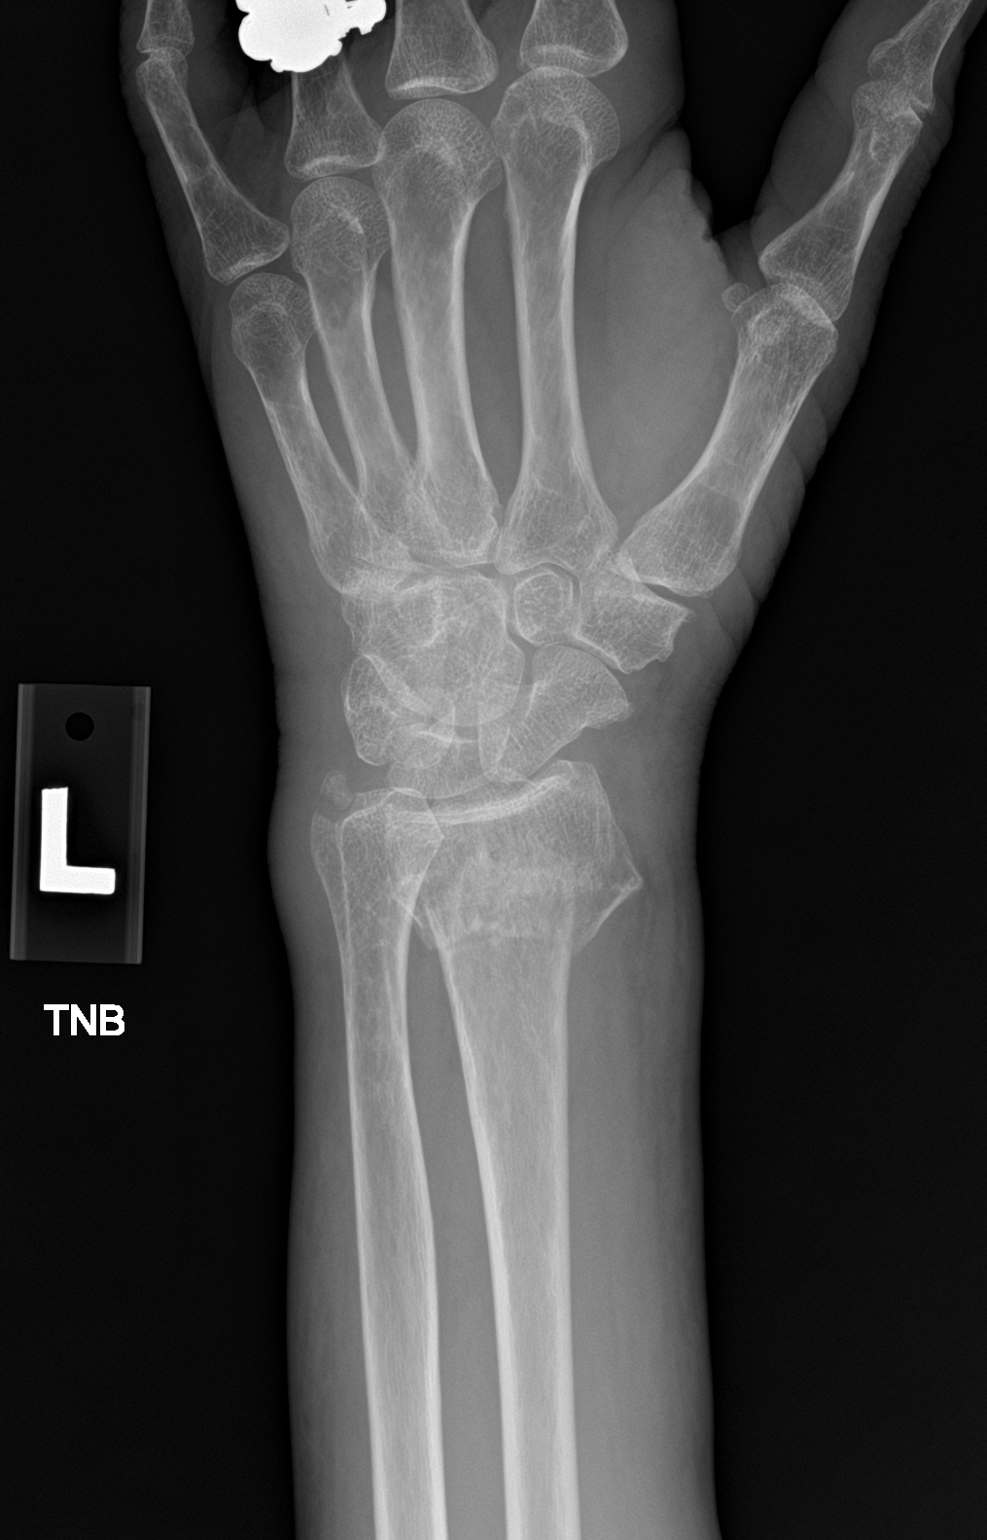

[wrist lat]
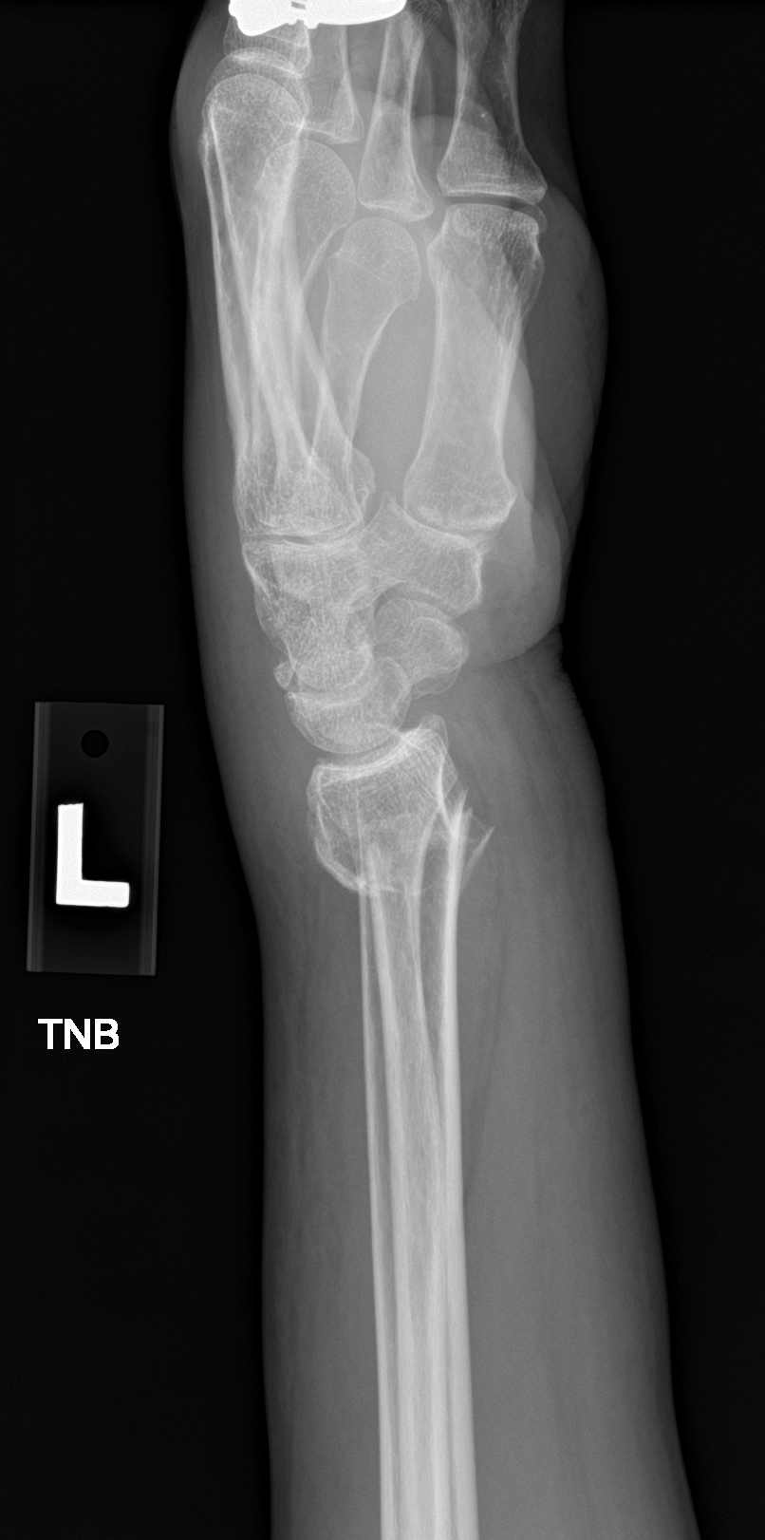

[4 of 4 positions shown; findings below may reference images not displayed]

FINDINGS: Four views of the left wrist demonstrate a comminuted impacted
fracture of the distal radial metaphysis which demonstrates minimal
dorsal angulation (approximately 10-15 degrees). No definite
intra-articular extension is confidently identified. There is also a
minimally displaced fracture of the ulnar styloid. Extensive soft
tissue swelling surrounding the wrist joint.
IMPRESSION: 1. Acute mildly comminuted and impacted dorsally angulated fracture
of the distal radial metaphyseal region, as above.
2. Minimally displaced ulnar styloid avulsion fracture.

## 2020-10-23 DIAGNOSIS — F4322 Adjustment disorder with anxiety: Secondary | ICD-10-CM | POA: Diagnosis not present

## 2020-10-23 DIAGNOSIS — N390 Urinary tract infection, site not specified: Secondary | ICD-10-CM | POA: Diagnosis not present

## 2020-10-23 DIAGNOSIS — R419 Unspecified symptoms and signs involving cognitive functions and awareness: Secondary | ICD-10-CM | POA: Diagnosis not present

## 2020-11-04 DIAGNOSIS — F419 Anxiety disorder, unspecified: Secondary | ICD-10-CM | POA: Diagnosis not present

## 2020-11-04 DIAGNOSIS — R4189 Other symptoms and signs involving cognitive functions and awareness: Secondary | ICD-10-CM | POA: Diagnosis not present
